# Patient Record
Sex: Female | Born: 1960 | Race: White | Hispanic: No | Smoking: Never smoker
Health system: Southern US, Community
[De-identification: ages and names within clinical notes are randomized; demographics above are authoritative.]

## PROBLEM LIST (undated history)

## (undated) DIAGNOSIS — F32A Depression, unspecified: Secondary | ICD-10-CM

## (undated) DIAGNOSIS — F329 Major depressive disorder, single episode, unspecified: Secondary | ICD-10-CM

## (undated) DIAGNOSIS — E669 Obesity, unspecified: Secondary | ICD-10-CM

## (undated) DIAGNOSIS — I10 Essential (primary) hypertension: Secondary | ICD-10-CM

## (undated) DIAGNOSIS — F419 Anxiety disorder, unspecified: Secondary | ICD-10-CM

## (undated) DIAGNOSIS — M109 Gout, unspecified: Secondary | ICD-10-CM

## (undated) DIAGNOSIS — M1990 Unspecified osteoarthritis, unspecified site: Secondary | ICD-10-CM

## (undated) HISTORY — DX: Unspecified osteoarthritis, unspecified site: M19.90

## (undated) HISTORY — DX: Essential (primary) hypertension: I10

## (undated) HISTORY — DX: Anxiety disorder, unspecified: F41.9

## (undated) HISTORY — DX: Major depressive disorder, single episode, unspecified: F32.9

## (undated) HISTORY — DX: Obesity, unspecified: E66.9

## (undated) HISTORY — PX: APPENDECTOMY: SHX54

## (undated) HISTORY — DX: Depression, unspecified: F32.A

## (undated) HISTORY — DX: Gout, unspecified: M10.9

---

## 1985-08-09 HISTORY — PX: CHOLECYSTECTOMY: SHX55

## 2005-12-16 ENCOUNTER — Emergency Department: Payer: Self-pay | Admitting: Emergency Medicine

## 2007-05-06 IMAGING — CT CT ANGIOGRAPHY NECK
1 of 8 series · 8 of 33 positions shown · IV contrast (APPLIED)
Comparison: none

REASON FOR EXAM: Headache
COMMENTS:

PROCEDURE:     CT  - CT ANGIOGRAPHY NECK W/WO  - December 16, 2005  [DATE]
RESULT:       Emergent CTA was performed of the neck.  The LEFT vertebral is
small compared to the RIGHT.  However, no obvious dissection is seen.  The
carotid arteries appear intact.

[Series 6: inspace · axial · 0.32mm/px · z∈[-712,-520]mm · 8 of 495 slices shown]
[im 55/495  soft-tissue]
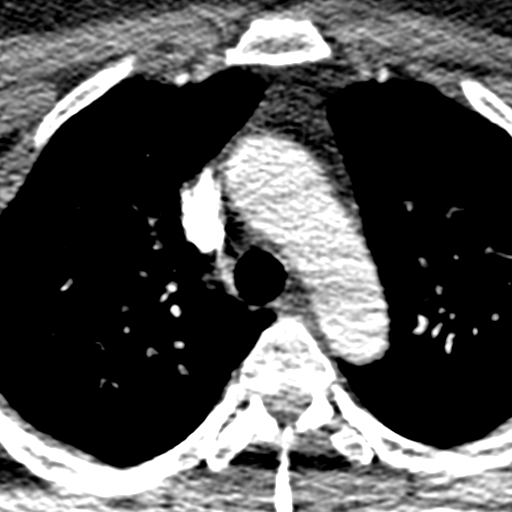
[im 110/495  bone]
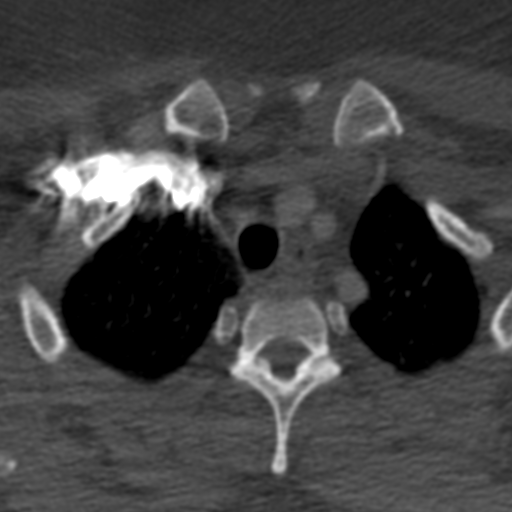
[im 165/495  soft-tissue]
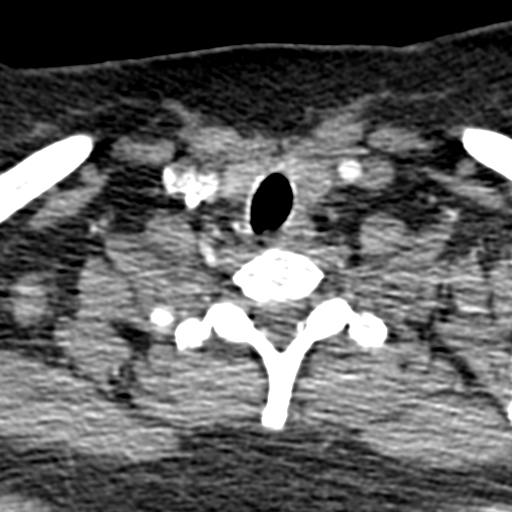
[im 220/495  bone]
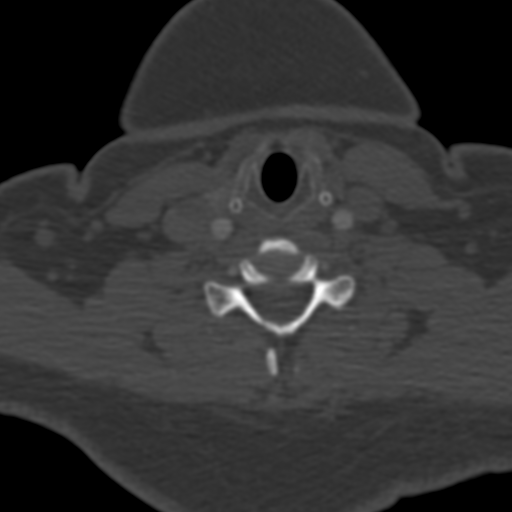
[im 275/495  soft-tissue]
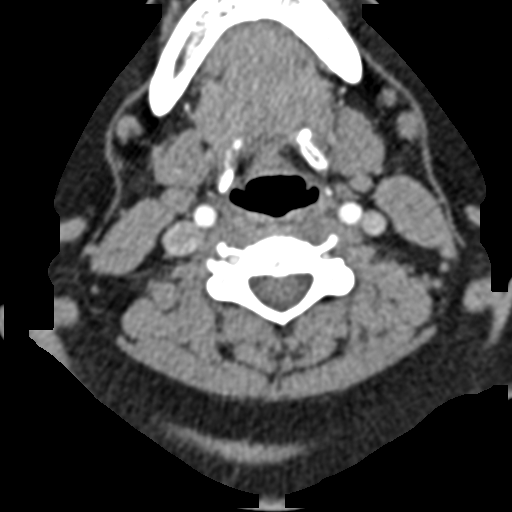
[im 330/495  bone]
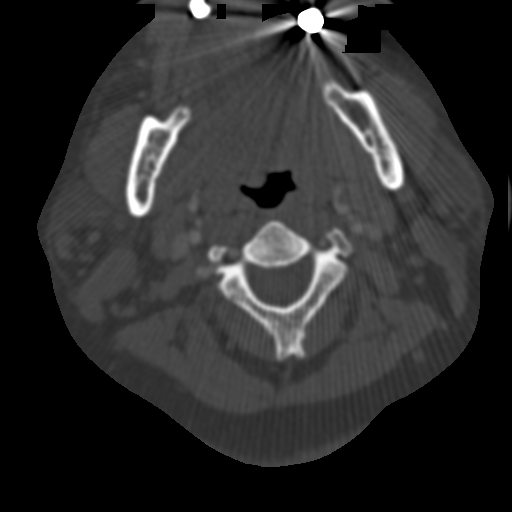
[im 385/495  soft-tissue]
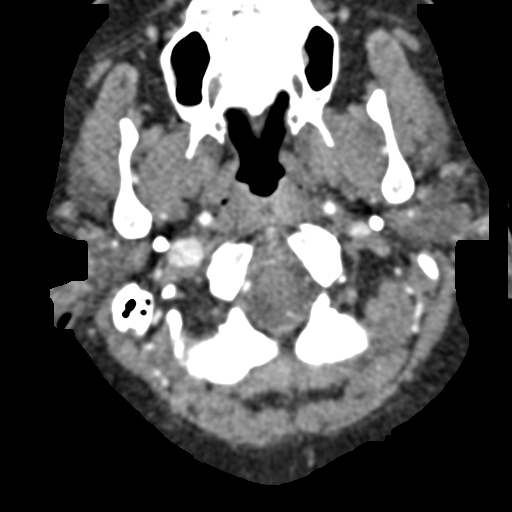
[im 440/495  bone]
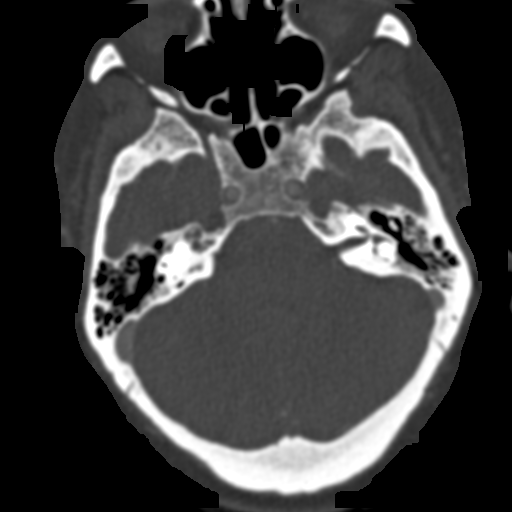

[8 of 33 positions shown; findings below may reference images not displayed]

IMPRESSION: No definitive vertebral artery dissection noted.

## 2007-11-25 ENCOUNTER — Other Ambulatory Visit: Payer: Self-pay

## 2007-11-25 ENCOUNTER — Emergency Department: Payer: Self-pay | Admitting: Emergency Medicine

## 2009-08-19 ENCOUNTER — Ambulatory Visit: Payer: Self-pay

## 2009-09-19 ENCOUNTER — Emergency Department (HOSPITAL_COMMUNITY): Admission: EM | Admit: 2009-09-19 | Discharge: 2009-09-19 | Payer: Self-pay | Admitting: Emergency Medicine

## 2011-02-07 IMAGING — CR DG CHEST 2V
2 series · 2 of 2 positions shown · non-contrast
Comparison: None

CLINICAL DATA: Upper respiratory infection.  Cough, congestion,
shortness of breath and mucus production for 3 days

CHEST - 2 VIEW

[w chest pa]
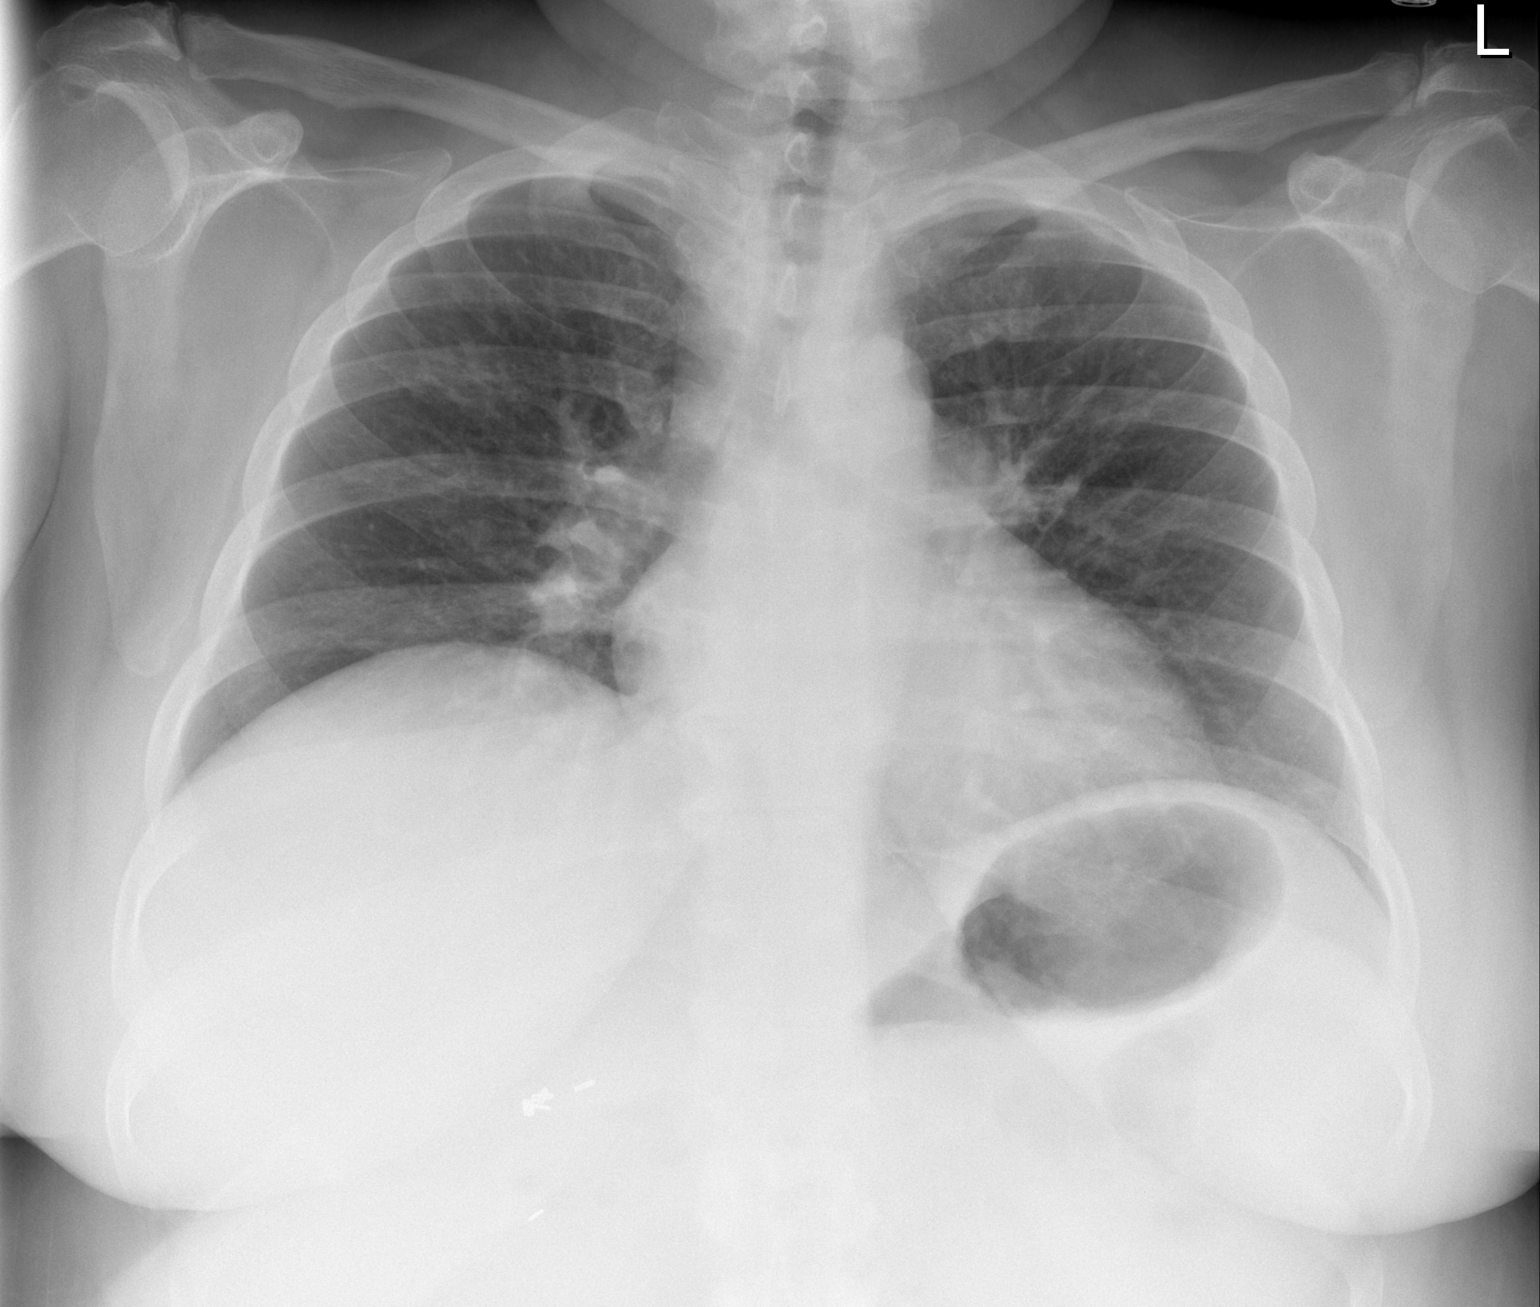

[w chest lat]
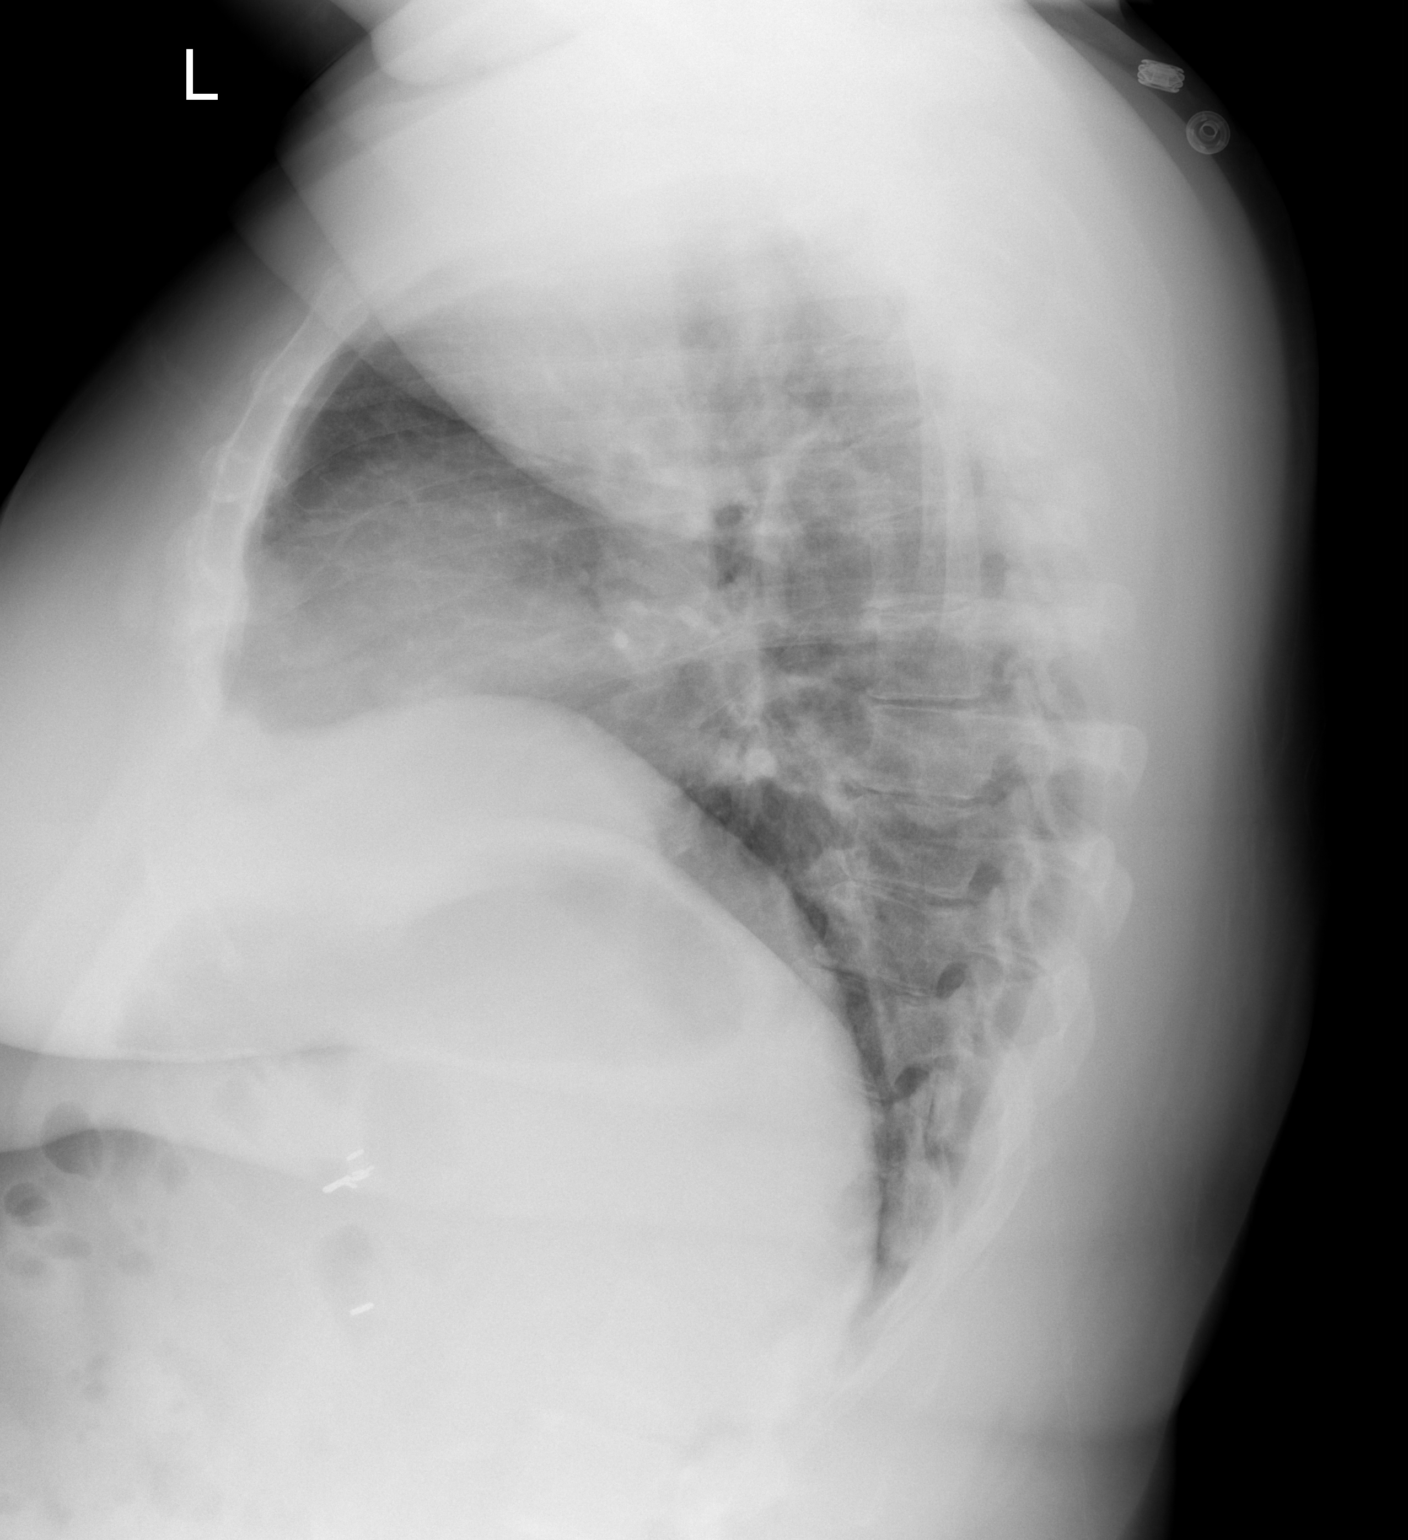

[2 of 2 positions shown; findings below may reference images not displayed]

FINDINGS: Low lung volumes are present.  Taking this into
consideration heart and mediastinal contours are within normal
limits.  The lung fields appear clear with no signs of focal
infiltrate or congestive failure.  No pleural fluid is seen.  No
significant peribronchial cuffing is noted.

Bony structures demonstrate degenerative anterior osteophytosis of
the lower thoracic spine and are otherwise intact.
IMPRESSION: No acute cardiopulmonary disease noted.  Low lung volumes

## 2013-10-30 LAB — HM PAP SMEAR

## 2013-11-19 LAB — HM MAMMOGRAPHY: HM MAMMO: NORMAL (ref 0–4)

## 2013-11-26 ENCOUNTER — Ambulatory Visit: Payer: Self-pay | Admitting: Specialist

## 2014-05-31 ENCOUNTER — Ambulatory Visit: Payer: Self-pay | Admitting: Gastroenterology

## 2014-08-07 ENCOUNTER — Emergency Department: Payer: Self-pay | Admitting: Emergency Medicine

## 2014-09-09 ENCOUNTER — Ambulatory Visit: Payer: Self-pay | Admitting: Specialist

## 2014-09-09 LAB — BASIC METABOLIC PANEL
Anion Gap: 6 — ABNORMAL LOW (ref 7–16)
BUN: 14 mg/dL (ref 7–18)
CO2: 29 mmol/L (ref 21–32)
Calcium, Total: 9.2 mg/dL (ref 8.5–10.1)
Chloride: 104 mmol/L (ref 98–107)
Creatinine: 0.77 mg/dL (ref 0.60–1.30)
EGFR (Non-African Amer.): >60
Glucose: 104 mg/dL — ABNORMAL HIGH (ref 65–99)
Osmolality: 278 (ref 275–301)
Potassium: 4.7 mmol/L (ref 3.5–5.1)
Sodium: 139 mmol/L (ref 136–145)

## 2014-09-10 ENCOUNTER — Inpatient Hospital Stay: Payer: Self-pay | Admitting: Specialist

## 2014-09-11 LAB — BASIC METABOLIC PANEL
Anion Gap: 9 (ref 7–16)
BUN: 9 mg/dL (ref 7–18)
CHLORIDE: 107 mmol/L (ref 98–107)
CREATININE: 0.74 mg/dL (ref 0.60–1.30)
Calcium, Total: 8.1 mg/dL — ABNORMAL LOW (ref 8.5–10.1)
Co2: 25 mmol/L (ref 21–32)
Glucose: 113 mg/dL — ABNORMAL HIGH (ref 65–99)
Osmolality: 281 (ref 275–301)
POTASSIUM: 4.4 mmol/L (ref 3.5–5.1)
Sodium: 141 mmol/L (ref 136–145)

## 2014-09-11 LAB — ALBUMIN: Albumin: 3.1 g/dL — ABNORMAL LOW (ref 3.4–5.0)

## 2014-09-11 LAB — MAGNESIUM: Magnesium: 1.7 mg/dL — ABNORMAL LOW

## 2014-09-11 LAB — CBC WITH DIFFERENTIAL/PLATELET
BASOS ABS: 0 10*3/uL (ref 0.0–0.1)
Basophil %: 0.2 %
Eosinophil #: 0 10*3/uL (ref 0.0–0.7)
Eosinophil %: 0 %
HCT: 38.6 % (ref 35.0–47.0)
HGB: 13.3 g/dL (ref 12.0–16.0)
Lymphocyte #: 1.4 10*3/uL (ref 1.0–3.6)
Lymphocyte %: 13.1 %
MCH: 29.3 pg (ref 26.0–34.0)
MCHC: 34.6 g/dL (ref 32.0–36.0)
MCV: 85 fL (ref 80–100)
MONOS PCT: 7.5 %
Monocyte #: 0.8 x10 3/mm (ref 0.2–0.9)
NEUTROS PCT: 79.2 %
Neutrophil #: 8.6 10*3/uL — ABNORMAL HIGH (ref 1.4–6.5)
PLATELETS: 202 10*3/uL (ref 150–440)
RBC: 4.55 10*6/uL (ref 3.80–5.20)
RDW: 14.4 % (ref 11.5–14.5)
WBC: 10.9 10*3/uL (ref 3.6–11.0)

## 2014-09-11 LAB — PHOSPHORUS: PHOSPHORUS: 3.4 mg/dL (ref 2.5–4.9)

## 2014-12-02 LAB — SURGICAL PATHOLOGY

## 2014-12-08 NOTE — Op Note (Signed)
PATIENT NAME:  Sandra [name], Sandra [name] MR#:  [id number] DATE OF BIRTH:  09-13-1960  DATE OF PROCEDURE:  09/10/2014  PREOPERATIVE DIAGNOSIS: Morbid obesity.   POSTOPERATIVE DIAGNOSIS: Morbid obesity.   PROCEDURE: Laparoscopic sleeve gastrectomy.   SURGEON: Primus BravoJon Idali Lafever, M.D.   ASSISTANT: Mariella SaaSarah Stout, PA-C  COMPLICATIONS: None.   ANESTHESIA: General endotracheal.   SPECIMENS: Portion of stomach.   ESTIMATED BLOOD LOSS: None.   CLINICAL HISTORY: See history and physical.   DETAILS OF PROCEDURE: The patient was taken to the operating room and placed on the operating table in the supine position. Appropriate monitors and supplemental oxygen delivered. Broad-spectrum IV antibiotics were administered and sequential stockings were placed. The patient was placed under general anesthesia without incident. The abdomen was prepped and draped in the usual sterile fashion. Timeout was performed. The abdomen was accessed using [name] 5 mm optical trocar, in the left upper quadrant. Pneumoperitoneum was established without difficulty. Multiple other ports were placed in preparation for the sleeve. [name] 36-French perforated bougie was placed down into the antrum. No hiatal hernia was present on inspection. The greater curvature of the stomach was mobilized using the Harmonic scalpel, including the fundus medially, and the posterior pancreatic attachments taken down as well. Once this was completely freed, the bougie was advanced to the antrum. The antrum bisected using an echelon green load reinforced stapler at 6 cm from the pylorus. The remainder of the fires were using [name] gold load with Seamguard. In [name] curvilinear fashion parallel to the lesser curvature, excess stomach was removed without difficulty. No bleeding was present. The staple line was intact. Liver retractor and trocars were removed. No bleeding occurred from any of the sites. The wounds were closed using 4-0 Vicryl and Dermabond. The patient tolerated the procedure  well and arrived to the recovery room in stable condition. She will be discharged home likely tomorrow. She will be admitted to the floor overnight.  ____________________________ Primus BravoJon Lynsee Wands, MD jb:sb D: 09/10/2014 14:13:40 ET T: 09/10/2014 14:37:24 ET JOB#: [id number]  cc: Primus BravoJon Jahmire Ruffins, MD, <Dictator> Geoffry ParadiseJON M Genola Yuille MD ELECTRONICALLY SIGNED 09/10/2014 16:31

## 2015-04-16 IMAGING — CR DG CHEST 2V
1 series · 2 of 2 positions shown · non-contrast
Comparison: None.

CLINICAL DATA: Hypertension.  Obesity.

EXAM:
CHEST  2 VIEW

[Series 1: w chest pa · 0.14mm/px · 2 of 2 slices shown]
[im 1/2]
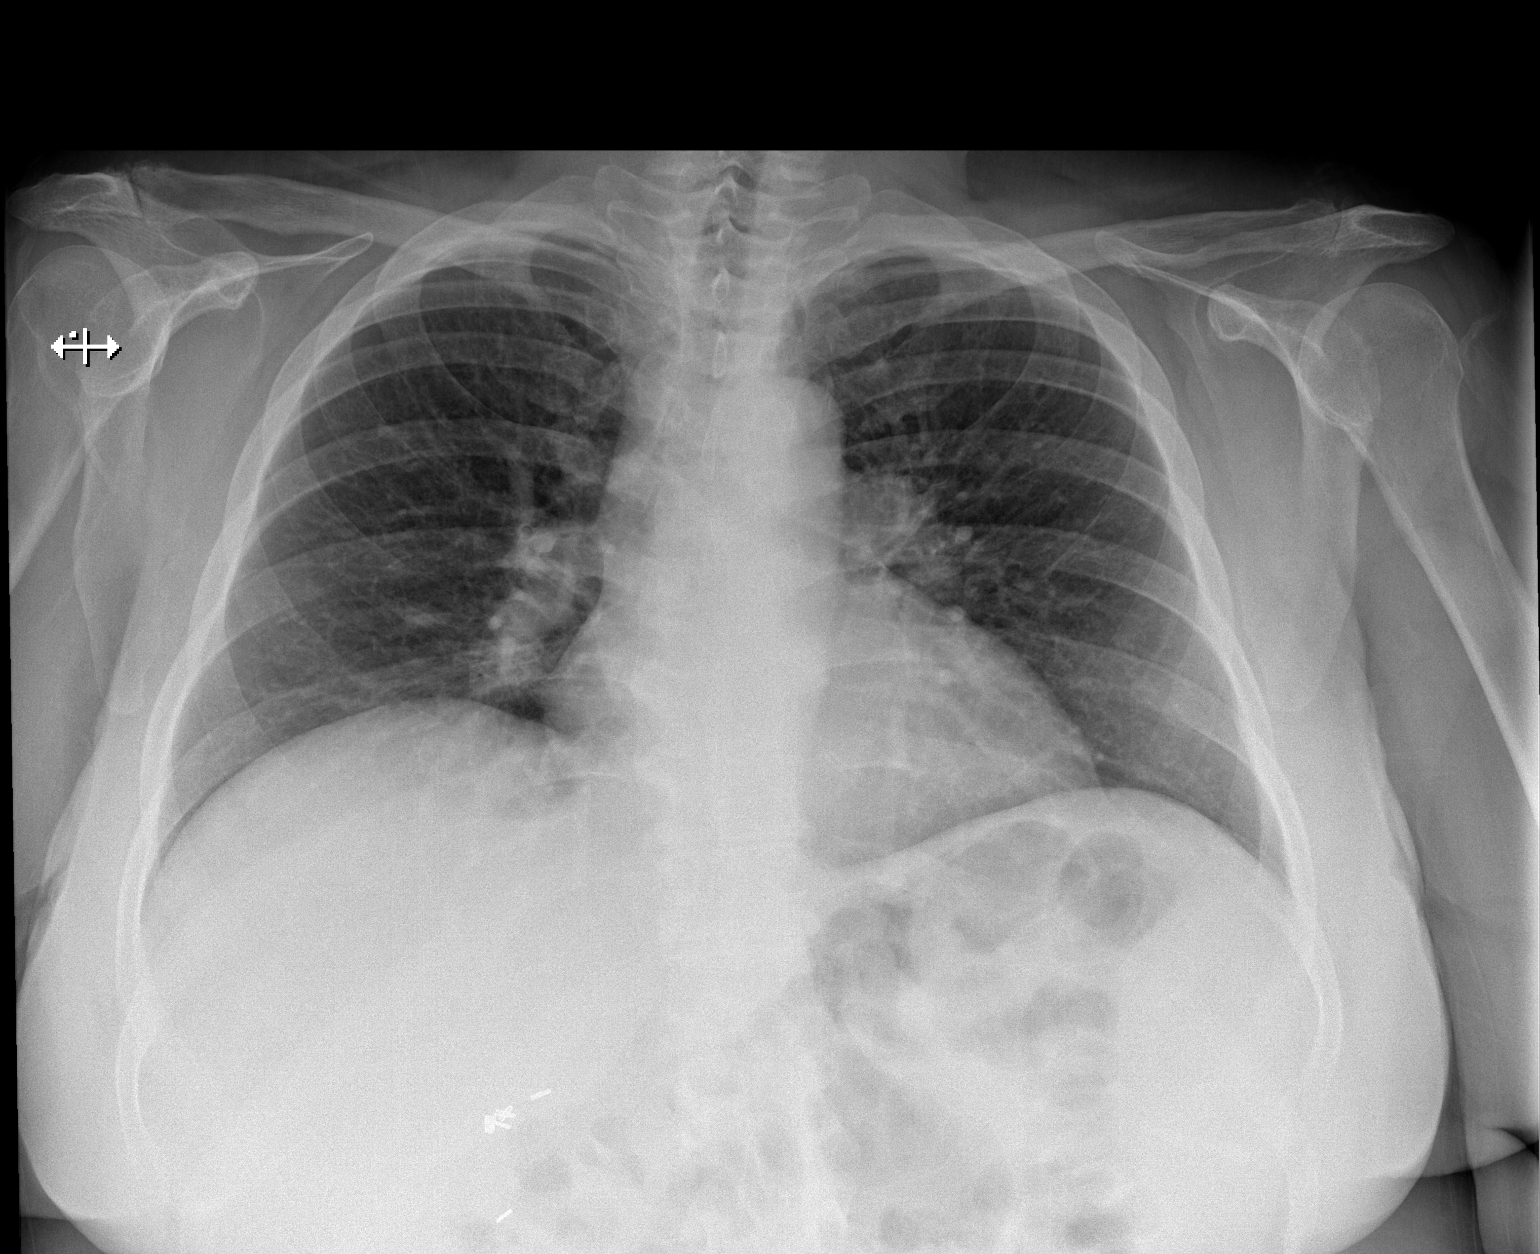
[im 2/2]
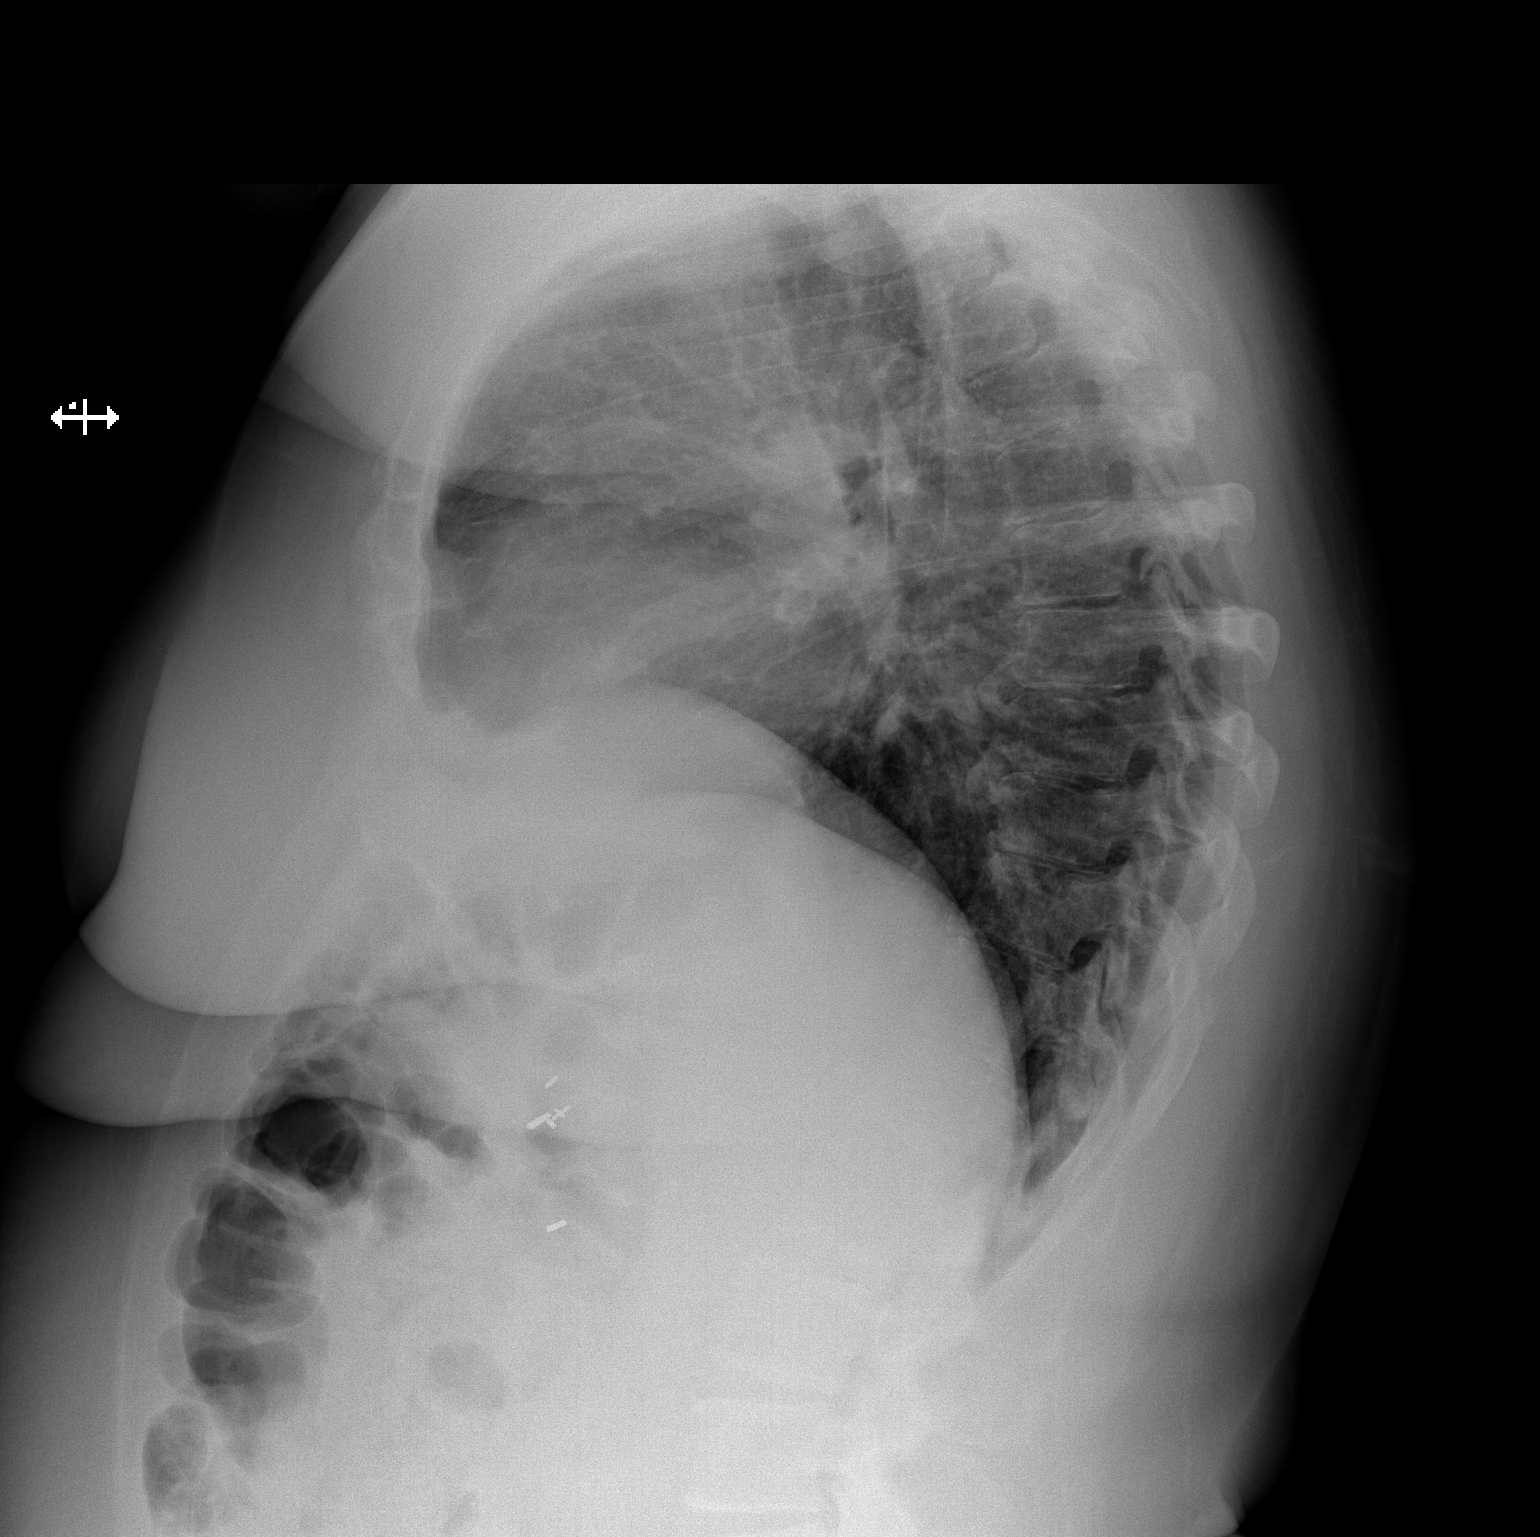

[2 of 2 positions shown; findings below may reference images not displayed]

FINDINGS: Mediastinum hilar structures unremarkable. Poor inspiration. Heart
size and pulmonary vascularity normal. No focal infiltrate. No
pleural effusion or pneumothorax. Surgical clips right upper
quadrant.
IMPRESSION: Poor inspiration.  No acute cardiopulmonary disease .

## 2016-05-25 ENCOUNTER — Ambulatory Visit: Payer: Self-pay | Admitting: Family Medicine

## 2016-05-27 ENCOUNTER — Ambulatory Visit (INDEPENDENT_AMBULATORY_CARE_PROVIDER_SITE_OTHER): Payer: 59 | Admitting: Family Medicine

## 2016-05-27 ENCOUNTER — Telehealth: Payer: Self-pay | Admitting: Family Medicine

## 2016-05-27 ENCOUNTER — Encounter: Payer: Self-pay | Admitting: Family Medicine

## 2016-05-27 VITALS — BP 123/86 | HR 85 | Temp 97.8°F | Ht 59.2 in | Wt 172.2 lb

## 2016-05-27 DIAGNOSIS — Z1211 Encounter for screening for malignant neoplasm of colon: Secondary | ICD-10-CM

## 2016-05-27 DIAGNOSIS — Z0189 Encounter for other specified special examinations: Secondary | ICD-10-CM

## 2016-05-27 DIAGNOSIS — Z008 Encounter for other general examination: Secondary | ICD-10-CM

## 2016-05-27 DIAGNOSIS — Z1231 Encounter for screening mammogram for malignant neoplasm of breast: Secondary | ICD-10-CM

## 2016-05-27 DIAGNOSIS — Z1239 Encounter for other screening for malignant neoplasm of breast: Secondary | ICD-10-CM

## 2016-05-27 NOTE — Telephone Encounter (Signed)
called pt to let her know that the paper work she wanted us to fax couldn't be fax because it didn't have the fax number on it or who to fax it to.I ask her to call back to get this informatio

## 2016-05-27 NOTE — Progress Notes (Signed)
   BP 123/86 (BP Location: Left Arm, Patient Position: Sitting, Cuff Size: Normal)   Pulse 85   Temp 97.8 F (36.6 C)   Ht 4' 11.2" (1.504 m)   Wt 172 lb 3.2 oz (78.1 kg)   SpO2 99%   BMI 34.55 kg/m    Subjective:    Patient ID: Sandra [name], female    DOB: Jul 21, 1961, 55 y.o.   MRN: [id number]  HPI: Sandra [name] is a 55 y.o. female  Chief Complaint  Patient presents with  . BMI Index calculation    Pt states that she is here to get her BMI calculated so that she can appeal your insurance company through work. The pt states that she had the gastric sleeve surgery 2 years ago. She follows a no carb no sugar diet everything is low cal food. Pt states that she works out 2 days a week and walks one day a week.    Patient presents for biometric screening appeal regarding her BMI. She is s/p gastric sleeve x 2 years and has lost a total of 72 lb overall. Having trouble keeping weight off, but has been exercising at least twice weekly and eating small portions, restricting carbohydrates. Just started weight watchers the past week or so.   Relevant past medical, surgical, family and social history reviewed and updated as indicated. Interim medical history since our last visit reviewed. Allergies and medications reviewed and updated.  Review of Systems  Constitutional: Negative.   HENT: Negative.   Respiratory: Negative.   Cardiovascular: Negative.   Gastrointestinal: Negative.   Genitourinary: Negative.   Musculoskeletal: Negative.   Skin: Negative.   Neurological: Negative.   Hematological: Negative.   Psychiatric/Behavioral: Negative.     Per HPI unless specifically indicated above     Objective:    BP 123/86 (BP Location: Left Arm, Patient Position: Sitting, Cuff Size: Normal)   Pulse 85   Temp 97.8 F (36.6 C)   Ht 4' 11.2" (1.504 m)   Wt 172 lb 3.2 oz (78.1 kg)   SpO2 99%   BMI 34.55 kg/m   Wt Readings from Last 3 Encounters:  05/27/16 172 lb 3.2  oz (78.1 kg)  06/05/14 209 lb (94.8 kg)    Physical Exam  Constitutional: She is oriented to person, place, and time. She appears well-developed and well-nourished. No distress.  HENT:  Head: Atraumatic.  Eyes: Conjunctivae are normal. No scleral icterus.  Neck: Normal range of motion. Neck supple.  Cardiovascular: Normal rate.   Pulmonary/Chest: Effort normal. No respiratory distress.  Musculoskeletal: Normal range of motion.  Neurological: She is alert and oriented to person, place, and time.  Skin: Skin is warm and dry.  Psychiatric: She has a normal mood and affect. Her behavior is normal.  Nursing note and vitals reviewed.     Assessment & Plan:   Problem List Items Addressed This Visit    None    Visit Diagnoses    Encounter for biometric screening    -  Primary   Form filled out   Colon cancer screening       Relevant Orders   Ambulatory referral to Gastroenterology   Breast cancer screening       Relevant Orders   MM Digital Screening       Follow up plan: Return if symptoms worsen or fail to improve.

## 2016-05-27 NOTE — Patient Instructions (Signed)
Follow up as needed

## 2016-05-28 NOTE — Telephone Encounter (Signed)
We don't have a fax number and I spent 20 minutes on hold with company. Left message for patient to pick up her form.

## 2018-09-07 DIAGNOSIS — M109 Gout, unspecified: Secondary | ICD-10-CM | POA: Insufficient documentation

## 2018-09-13 DIAGNOSIS — Z9884 Bariatric surgery status: Secondary | ICD-10-CM | POA: Insufficient documentation

## 2019-01-19 DIAGNOSIS — Z6835 Body mass index (BMI) 35.0-35.9, adult: Secondary | ICD-10-CM | POA: Insufficient documentation

## 2019-01-19 MED ORDER — CVS EAR DROPS OT
40.00 | OTIC | Status: DC
Start: 2019-01-20 — End: 2019-01-19

## 2019-01-19 MED ORDER — ASPIRIN-CAFFEINE PO
5.00 | ORAL | Status: DC
Start: ? — End: 2019-01-19

## 2019-01-19 MED ORDER — GENERIC EXTERNAL MEDICATION
12.50 | Status: DC
Start: ? — End: 2019-01-19

## 2019-01-19 MED ORDER — ALBUTEROL SULFATE (2.5 MG/3ML) 0.083% IN NEBU
2.50 | INHALATION_SOLUTION | RESPIRATORY_TRACT | Status: DC
Start: ? — End: 2019-01-19

## 2019-01-19 MED ORDER — DIPHENHYDRAMINE HCL 50 MG/ML IJ SOLN
25.00 | INTRAMUSCULAR | Status: DC
Start: ? — End: 2019-01-19

## 2019-01-19 MED ORDER — PANTOPRAZOLE SODIUM 40 MG IV SOLR
40.00 | INTRAVENOUS | Status: DC
Start: 2019-01-20 — End: 2019-01-19

## 2019-01-19 MED ORDER — EQUATE NICOTINE 4 MG MT GUM
4.00 | CHEWING_GUM | OROMUCOSAL | Status: DC
Start: ? — End: 2019-01-19

## 2019-01-19 MED ORDER — LOVENOX 150 MG/ML ~~LOC~~ SOLN
1.00 | SUBCUTANEOUS | Status: DC
Start: ? — End: 2019-01-19

## 2019-01-19 MED ORDER — BARO-CAT PO
150.00 | ORAL | Status: DC
Start: ? — End: 2019-01-19

## 2019-01-19 MED ORDER — PHENYLEPHRINE-GUAIFENESIN 20-375 MG PO CP12
10.00 | ORAL_CAPSULE | ORAL | Status: DC
Start: ? — End: 2019-01-19

## 2019-08-06 DIAGNOSIS — M1A09X0 Idiopathic chronic gout, multiple sites, without tophus (tophi): Secondary | ICD-10-CM | POA: Insufficient documentation

## 2020-02-07 DIAGNOSIS — I10 Essential (primary) hypertension: Secondary | ICD-10-CM | POA: Insufficient documentation

## 2020-10-23 DIAGNOSIS — F411 Generalized anxiety disorder: Secondary | ICD-10-CM | POA: Insufficient documentation

## 2020-10-23 DIAGNOSIS — F331 Major depressive disorder, recurrent, moderate: Secondary | ICD-10-CM | POA: Insufficient documentation

## 2021-08-21 ENCOUNTER — Other Ambulatory Visit: Payer: Self-pay

## 2021-08-21 ENCOUNTER — Encounter: Payer: Self-pay | Admitting: Nurse Practitioner

## 2021-08-21 ENCOUNTER — Ambulatory Visit (INDEPENDENT_AMBULATORY_CARE_PROVIDER_SITE_OTHER): Payer: Self-pay | Admitting: Nurse Practitioner

## 2021-08-21 VITALS — BP 135/81 | HR 65 | Temp 98.2°F | Ht 59.6 in | Wt 159.0 lb

## 2021-08-21 DIAGNOSIS — F411 Generalized anxiety disorder: Secondary | ICD-10-CM

## 2021-08-21 DIAGNOSIS — F331 Major depressive disorder, recurrent, moderate: Secondary | ICD-10-CM

## 2021-08-21 DIAGNOSIS — I10 Essential (primary) hypertension: Secondary | ICD-10-CM

## 2021-08-21 DIAGNOSIS — Z7689 Persons encountering health services in other specified circumstances: Secondary | ICD-10-CM

## 2021-08-21 DIAGNOSIS — R141 Gas pain: Secondary | ICD-10-CM

## 2021-08-21 MED ORDER — LOSARTAN POTASSIUM 100 MG PO TABS: ORAL_TABLET | ORAL | 1 refills | Status: DC

## 2021-08-21 NOTE — Progress Notes (Signed)
BP 135/81    Pulse 65    Temp 98.2 F (36.8 C) (Oral)    Ht 4' 11.6" (1.514 m)    Wt 159 lb (72.1 kg)    LMP  (LMP Unknown)    SpO2 99%    BMI 31.47 kg/m    Subjective:    Patient ID: Sandra [name], female    DOB: Jan 06, 1961, 61 y.o.   MRN: [id number]  HPI: Sandra [name] is a 61 y.o. female  Chief Complaint  Patient presents with   New Patient (Initial Visit)   Establish Care    Patient presents to clinic to establish care with new PCP.  Introduced to Designer, jewellery role and practice setting.  All questions answered.  Discussed provider/patient relationship and expectations.  Patient reports a history of hypertension, anxiety (doesn't take any medication), Gout, headaches, history of bariatric surgery- 2020 was her most recent.  She had a previous bariatric surgery in 2015.  She has the bariatric surgery at [facility].   Patient denies a history of: Hypertension, Elevated Cholesterol, Diabetes, Thyroid problems, Depression, Anxiety, Neurological problems, and Abdominal problems.   HYPERTENSION Hypertension status: controlled  Satisfied with current treatment? no Duration of hypertension: years BP monitoring frequency:  daily BP range: 140/90 BP medication side effects:  no Medication compliance: excellent compliance Previous BP meds:losartan (cozaar) Aspirin: no Recurrent headaches: yes Visual changes: no Palpitations: no Dyspnea: no Chest pain: no Lower extremity edema: no Dizzy/lightheaded: no  ABDOMINAL ISSUES Patient states since she is having a lot of gas pain since her surgery.  She is following the diet that she was given. Finished her follow up with the bariatric surgery.    Active Ambulatory Problems    Diagnosis Date Noted   BMI 35.0-35.9,adult 01/19/2019   Essential hypertension 02/07/2020   Generalized anxiety disorder 10/23/2020   Idiopathic chronic gout of multiple sites without tophus 08/06/2019   Moderate episode of recurrent  major depressive disorder ([place]) 10/23/2020   Morbid obesity ([place]) 01/18/2019   Status post bariatric surgery 09/13/2018   Abdominal gas pain 08/21/2021   Resolved Ambulatory Problems    Diagnosis Date Noted   No Resolved Ambulatory Problems   Past Medical History:  Diagnosis Date   Anxiety    Arthritis    Depression    Gout    Hypertension    Obesity    Past Surgical History:  Procedure Laterality Date   APPENDECTOMY     CESAREAN SECTION     x 2   CHOLECYSTECTOMY  1987   Family History  Problem Relation Age of Onset   Heart disease Mother    Heart attack Mother    Hypertension Mother    Cancer Mother        breast   Stroke Mother    Heart disease Father    Heart attack Father      Review of Systems  Eyes:  Negative for visual disturbance.  Respiratory:  Negative for cough, chest tightness and shortness of breath.   Cardiovascular:  Negative for chest pain, palpitations and leg swelling.  Neurological:  Positive for headaches. Negative for dizziness.   Per HPI unless specifically indicated above     Objective:    BP 135/81    Pulse 65    Temp 98.2 F (36.8 C) (Oral)    Ht 4' 11.6" (1.514 m)    Wt 159 lb (72.1 kg)    LMP  (LMP Unknown)    SpO2 99%  BMI 31.47 kg/m   Wt Readings from Last 3 Encounters:  08/21/21 159 lb (72.1 kg)  05/27/16 172 lb 3.2 oz (78.1 kg)  06/05/14 209 lb (94.8 kg)    Physical Exam Vitals and nursing note reviewed.  Constitutional:      General: She is not in acute distress.    Appearance: Normal appearance. She is normal weight. She is not ill-appearing, toxic-appearing or diaphoretic.  HENT:     Head: Normocephalic.     Right Ear: Tympanic membrane and external ear normal.     Left Ear: Tympanic membrane and external ear normal.     Nose: Nose normal.     Mouth/Throat:     Mouth: Mucous membranes are moist.     Pharynx: Oropharynx is clear.  Eyes:     General:        Right eye: No discharge.        Left eye: No  discharge.     Extraocular Movements: Extraocular movements intact.     Conjunctiva/sclera: Conjunctivae normal.     Pupils: Pupils are equal, round, and reactive to light.  Cardiovascular:     Rate and Rhythm: Normal rate and regular rhythm.     Heart sounds: No murmur heard. Pulmonary:     Effort: Pulmonary effort is normal. No respiratory distress.     Breath sounds: Normal breath sounds. No wheezing or rales.  Musculoskeletal:     Cervical back: Normal range of motion and neck supple.  Skin:    General: Skin is warm and dry.     Capillary Refill: Capillary refill takes less than 2 seconds.  Neurological:     General: No focal deficit present.     Mental Status: She is alert and oriented to person, place, and time. Mental status is at baseline.  Psychiatric:        Mood and Affect: Mood normal.        Behavior: Behavior normal.        Thought Content: Thought content normal.        Judgment: Judgment normal.    Results for orders placed or performed in visit on 05/24/16  HM MAMMOGRAPHY  Result Value Ref Range   HM Mammogram Self Reported Normal 0-4 Bi-Rad, Self Reported Normal  HM PAP SMEAR  Result Value Ref Range   HM Pap smear per PP       Assessment & Plan:   Problem List Items Addressed This Visit       Cardiovascular and Mediastinum   Essential hypertension    Chronic.  Controlled.  Continue with current medication regimen of Losartan 100mg  daily.  Refill sent today.  Return to clinic in 6 weeks for reevaluation.  Call sooner if concerns arise.        Relevant Medications   losartan (COZAAR) 100 MG tablet     Other   Generalized anxiety disorder    Chronic.  Controlled without medication.  Return to clinic in 6 months for reevaluation.  Call sooner if concerns arise.        Moderate episode of recurrent major depressive disorder (HCC)    Chronic.  Controlled without medication. Return to clinic in 6 months for reevaluation.  Call sooner if concerns  arise.        Morbid obesity ([place])    Has a history of bariatric surgery x 2.  Most recent in 2020.  Completed follow up with bariatric surgeon.      Abdominal gas pain  Patient consuming a lot of green leafy vegetables. Recommend modifying diet to avoid green leafy vegetables. If not improved, will send to GI. Avoid medications such as gas x due to history of bariatric surgery.      Other Visit Diagnoses     Encounter to establish care    -  Primary        Follow up plan: Return in about 6 weeks (around 10/02/2021) for BP Check and abdominal concerns.

## 2021-08-21 NOTE — Assessment & Plan Note (Signed)
Chronic.  Controlled without medication.  Return to clinic in 6 months for reevaluation.  Call sooner if concerns arise.   

## 2021-08-21 NOTE — Assessment & Plan Note (Signed)
Patient consuming a lot of green leafy vegetables. Recommend modifying diet to avoid green leafy vegetables. If not improved, will send to GI. Avoid medications such as gas x due to history of bariatric surgery.

## 2021-08-21 NOTE — Assessment & Plan Note (Signed)
Has a history of bariatric surgery x 2.  Most recent in 2020.  Completed follow up with bariatric surgeon.

## 2021-08-21 NOTE — Assessment & Plan Note (Signed)
Chronic.  Controlled.  Continue with current medication regimen of Losartan 100mg  daily.  Refill sent today.  Return to clinic in 6 weeks for reevaluation.  Call sooner if concerns arise.

## 2021-10-02 ENCOUNTER — Other Ambulatory Visit: Payer: Self-pay

## 2021-10-02 ENCOUNTER — Encounter: Payer: Self-pay | Admitting: Nurse Practitioner

## 2021-10-02 ENCOUNTER — Ambulatory Visit (INDEPENDENT_AMBULATORY_CARE_PROVIDER_SITE_OTHER): Payer: Self-pay | Admitting: Nurse Practitioner

## 2021-10-02 VITALS — BP 134/78 | HR 65 | Temp 98.6°F | Wt 159.2 lb

## 2021-10-02 DIAGNOSIS — R141 Gas pain: Secondary | ICD-10-CM

## 2021-10-02 DIAGNOSIS — F411 Generalized anxiety disorder: Secondary | ICD-10-CM

## 2021-10-02 DIAGNOSIS — I10 Essential (primary) hypertension: Secondary | ICD-10-CM

## 2021-10-02 MED ORDER — COLCHICINE 0.6 MG PO TABS: 0.6000 mg | ORAL_TABLET | Freq: Every day | ORAL | 1 refills | Status: DC | PRN

## 2021-10-02 NOTE — Assessment & Plan Note (Signed)
Chronic.  Controlled.  Continue with current medication regimen of Losartan 100mg daily.    Return to clinic in 3 months for reevaluation.  Call sooner if concerns arise.  ? ?

## 2021-10-02 NOTE — Assessment & Plan Note (Signed)
Referral placed for patient to see a therapist.  Also recommended patient to try [facility] to match with a therapist.  Follow up in 3 months for reevaluation.

## 2021-10-02 NOTE — Patient Instructions (Signed)
[facility]- therapy app

## 2021-10-02 NOTE — Assessment & Plan Note (Signed)
Ongoing. Improved with decreasing Greeny Leafy Vegetables. Continue to modify diet.  Recommend patient see GI doctor if symptoms persist.

## 2021-10-02 NOTE — Progress Notes (Signed)
BP 134/78    Pulse 65    Temp 98.6 F (37 C) (Oral)    Wt 159 lb 3.2 oz (72.2 kg)    LMP  (LMP Unknown)    SpO2 98%    BMI 31.51 kg/m    Subjective:    Patient ID: Sandra [name], female    DOB: [birth year], 61 y.o.   MRN: [id number]  HPI: Sandra [name] is a 61 y.o. female  Chief Complaint  Patient presents with   Hypertension    6 week f/up   Bloated    Pt states she is still having issues with her stomach and being bloated    Therapy    Pt states she would like to discuss going to therapy for mental health    HYPERTENSION Hypertension status: controlled  Satisfied with current treatment? no Duration of hypertension: years BP monitoring frequency:  daily BP range: 132/80 BP medication side effects:  no Medication compliance: excellent compliance Previous BP meds:losartan (cozaar) Aspirin: no Recurrent headaches: no Visual changes: no Palpitations: no Dyspnea: no Chest pain: no Lower extremity edema: no Dizzy/lightheaded: no  ABDOMINAL CONCERNS Patient is here to follow up on her abdominal pain.  States she has times where she has to run right to the bathroom.  She had decreased the green leafy vegetables which helped some but she still has a lot of bloating.    Patient would like to see a therapist.  She feels like she needs someone else to talk to.  Relevant past medical, surgical, family and social history reviewed and updated as indicated. Interim medical history since our last visit reviewed. Allergies and medications reviewed and updated.  Review of Systems  Eyes:  Negative for visual disturbance.  Respiratory:  Negative for cough, chest tightness and shortness of breath.   Cardiovascular:  Negative for chest pain, palpitations and leg swelling.  Gastrointestinal:  Positive for abdominal pain.  Neurological:  Negative for dizziness and headaches.  Psychiatric/Behavioral:  The patient is nervous/anxious.    Per HPI unless specifically  indicated above     Objective:    BP 134/78    Pulse 65    Temp 98.6 F (37 C) (Oral)    Wt 159 lb 3.2 oz (72.2 kg)    LMP  (LMP Unknown)    SpO2 98%    BMI 31.51 kg/m   Wt Readings from Last 3 Encounters:  10/02/21 159 lb 3.2 oz (72.2 kg)  08/21/21 159 lb (72.1 kg)  05/27/16 172 lb 3.2 oz (78.1 kg)    Physical Exam Vitals and nursing note reviewed.  Constitutional:      General: She is not in acute distress.    Appearance: Normal appearance. She is normal weight. She is not ill-appearing, toxic-appearing or diaphoretic.  HENT:     Head: Normocephalic.     Right Ear: External ear normal.     Left Ear: External ear normal.     Nose: Nose normal.     Mouth/Throat:     Mouth: Mucous membranes are moist.     Pharynx: Oropharynx is clear.  Eyes:     General:        Right eye: No discharge.        Left eye: No discharge.     Extraocular Movements: Extraocular movements intact.     Conjunctiva/sclera: Conjunctivae normal.     Pupils: Pupils are equal, round, and reactive to light.  Cardiovascular:     Rate and  Rhythm: Normal rate and regular rhythm.     Heart sounds: No murmur heard. Pulmonary:     Effort: Pulmonary effort is normal. No respiratory distress.     Breath sounds: Normal breath sounds. No wheezing or rales.  Musculoskeletal:     Cervical back: Normal range of motion and neck supple.  Skin:    General: Skin is warm and dry.     Capillary Refill: Capillary refill takes less than 2 seconds.  Neurological:     General: No focal deficit present.     Mental Status: She is alert and oriented to person, place, and time. Mental status is at baseline.  Psychiatric:        Mood and Affect: Mood normal.        Behavior: Behavior normal.        Thought Content: Thought content normal.        Judgment: Judgment normal.    Results for orders placed or performed in visit on 05/24/16  HM MAMMOGRAPHY  Result Value Ref Range   HM Mammogram Self Reported Normal 0-4 Bi-Rad,  Self Reported Normal  HM PAP SMEAR  Result Value Ref Range   HM Pap smear per PP       Assessment & Plan:   Problem List Items Addressed This Visit       Cardiovascular and Mediastinum   Essential hypertension - Primary    Chronic.  Controlled.  Continue with current medication regimen of Losartan 100mg  daily.  Return to clinic in 3 months for reevaluation.  Call sooner if concerns arise.          Other   Generalized anxiety disorder    Referral placed for patient to see a therapist.  Also recommended patient to try [facility] to match with a therapist.  Follow up in 3 months for reevaluation.      Relevant Orders   Ambulatory referral to Psychology   Abdominal gas pain    Ongoing. Improved with decreasing Greeny Leafy Vegetables. Continue to modify diet.  Recommend patient see GI doctor if symptoms persist.        Follow up plan: Return in about 3 months (around 12/30/2021) for BP Check.

## 2021-12-24 NOTE — Progress Notes (Signed)
BP (!) 179/83   Pulse (!) 54   Temp 97.9 F (36.6 C) (Oral)   Wt 160 lb 4.8 oz (72.7 kg)   LMP  (LMP Unknown)   SpO2 97%   BMI 31.73 kg/m    Subjective:    Patient ID: Sandra [name], female    DOB: 07-07-1961, 61 y.o.   MRN: [id number]  HPI: Sandra [name] is a 61 y.o. female  Chief Complaint  Patient presents with   Anxiety   Hypertension    Follow up    Abdominal Pain    Ongoing- lots of gas per patient    HYPERTENSION Hypertension status: uncontrolled Satisfied with current treatment? no Duration of hypertension: years BP monitoring frequency:  daily BP range: 140-150/80 BP medication side effects:  no Medication compliance: excellent compliance Previous BP meds:losartan (cozaar) Aspirin: no Recurrent headaches: no Visual changes: no Palpitations: no Dyspnea: no Chest pain: no Lower extremity edema: no Dizzy/lightheaded: no  ABDOMINAL CONCERNS Patient is here to follow up on her abdominal pain.  States she has times where she has to run right to the bathroom.  She had decreased the green leafy vegetables which helped some but she still has a lot of bloating.  She has not been following a strict diet.  She has been more lenient with her diet.    ANXIETY Patient states she has been very stressed lately.  She is trying to move and her job has been very stressful.   Patient would like to see a therapist.  She feels like she needs someone else to talk to.  Relevant past medical, surgical, family and social history reviewed and updated as indicated. Interim medical history since our last visit reviewed. Allergies and medications reviewed and updated.  Review of Systems  Eyes:  Negative for visual disturbance.  Respiratory:  Negative for cough, chest tightness and shortness of breath.   Cardiovascular:  Negative for chest pain, palpitations and leg swelling.  Gastrointestinal:  Positive for abdominal pain.  Neurological:  Negative for dizziness and headaches.   Psychiatric/Behavioral:  The patient is nervous/anxious.    Per HPI unless specifically indicated above     Objective:    BP (!) 179/83   Pulse (!) 54   Temp 97.9 F (36.6 C) (Oral)   Wt 160 lb 4.8 oz (72.7 kg)   LMP  (LMP Unknown)   SpO2 97%   BMI 31.73 kg/m   Wt Readings from Last 3 Encounters:  12/25/21 160 lb 4.8 oz (72.7 kg)  10/02/21 159 lb 3.2 oz (72.2 kg)  08/21/21 159 lb (72.1 kg)    Physical Exam Vitals and nursing note reviewed.  Constitutional:      General: She is not in acute distress.    Appearance: Normal appearance. She is normal weight. She is not ill-appearing, toxic-appearing or diaphoretic.  HENT:     Head: Normocephalic.     Right Ear: External ear normal.     Left Ear: External ear normal.     Nose: Nose normal.     Mouth/Throat:     Mouth: Mucous membranes are moist.     Pharynx: Oropharynx is clear.  Eyes:     General:        Right eye: No discharge.        Left eye: No discharge.     Extraocular Movements: Extraocular movements intact.     Conjunctiva/sclera: Conjunctivae normal.     Pupils: Pupils are equal, round, and reactive to light.  Cardiovascular:     Rate and Rhythm: Normal rate and regular rhythm.     Heart sounds: No murmur heard. Pulmonary:     Effort: Pulmonary effort is normal. No respiratory distress.     Breath sounds: Normal breath sounds. No wheezing or rales.  Musculoskeletal:     Cervical back: Normal range of motion and neck supple.  Skin:    General: Skin is warm and dry.     Capillary Refill: Capillary refill takes less than 2 seconds.  Neurological:     General: No focal deficit present.     Mental Status: She is alert and oriented to person, place, and time. Mental status is at baseline.  Psychiatric:        Mood and Affect: Mood normal.        Behavior: Behavior normal.        Thought Content: Thought content normal.        Judgment: Judgment normal.    Results for orders placed or performed in visit  on 05/24/16  HM MAMMOGRAPHY  Result Value Ref Range   HM Mammogram Self Reported Normal 0-4 Bi-Rad, Self Reported Normal  HM PAP SMEAR  Result Value Ref Range   HM Pap smear per PP       Assessment & Plan:   Problem List Items Addressed This Visit       Cardiovascular and Mediastinum   Essential hypertension - Primary    Chronic. Not well controlled.  Will add Amlodipine 20m daily.  Side effects and benefits of medication discussed during visit.  Continue with Losartan.  Follow up in 1 month for reevaluation.       Relevant Medications   amLODipine (NORVASC) 5 MG tablet   Other Relevant Orders   Comp Met (CMET)     Other   Generalized anxiety disorder    Chronic. Has been worse lately due to stress in her life.  Will reassess at next visit.       Status post bariatric surgery    Patient would like to discuss weight loss medications.  Recommend she speak with her Bariatric surgeon to get the okay to start medications.  Will discuss further at our follow up.       Abdominal gas pain    Chronic. Ongoing since bariatric surgery.  Patient has not been diligent about her diet.  Recommend she see Gi and have a colonoscopy done.  Recommend she follow up with her Bariatric surgeon to discuss her abdominal pain.       Relevant Orders   Ambulatory referral to Gastroenterology   Other Visit Diagnoses     Screening for ischemic heart disease       Relevant Orders   Lipid Profile        Follow up plan: Return in about 1 month (around 01/25/2022) for BP Check.

## 2021-12-25 ENCOUNTER — Encounter: Payer: Self-pay | Admitting: Nurse Practitioner

## 2021-12-25 ENCOUNTER — Ambulatory Visit (INDEPENDENT_AMBULATORY_CARE_PROVIDER_SITE_OTHER): Payer: PRIVATE HEALTH INSURANCE | Admitting: Nurse Practitioner

## 2021-12-25 VITALS — BP 179/83 | HR 54 | Temp 97.9°F | Wt 160.3 lb

## 2021-12-25 DIAGNOSIS — Z136 Encounter for screening for cardiovascular disorders: Secondary | ICD-10-CM | POA: Diagnosis not present

## 2021-12-25 DIAGNOSIS — F411 Generalized anxiety disorder: Secondary | ICD-10-CM | POA: Diagnosis not present

## 2021-12-25 DIAGNOSIS — I10 Essential (primary) hypertension: Secondary | ICD-10-CM

## 2021-12-25 DIAGNOSIS — Z9884 Bariatric surgery status: Secondary | ICD-10-CM

## 2021-12-25 DIAGNOSIS — F331 Major depressive disorder, recurrent, moderate: Secondary | ICD-10-CM

## 2021-12-25 DIAGNOSIS — R141 Gas pain: Secondary | ICD-10-CM | POA: Diagnosis not present

## 2021-12-25 MED ORDER — AMLODIPINE BESYLATE 5 MG PO TABS: 5.0000 mg | ORAL_TABLET | Freq: Every day | ORAL | 0 refills | Status: DC

## 2021-12-25 NOTE — Assessment & Plan Note (Signed)
Patient would like to discuss weight loss medications.  Recommend she speak with her Bariatric surgeon to get the okay to start medications.  Will discuss further at our follow up.

## 2021-12-25 NOTE — Assessment & Plan Note (Signed)
Chronic. Has been worse lately due to stress in her life.  Will reassess at next visit.

## 2021-12-25 NOTE — Assessment & Plan Note (Signed)
Chronic. Ongoing since bariatric surgery.  Patient has not been diligent about her diet.  Recommend she see Gi and have a colonoscopy done.  Recommend she follow up with her Bariatric surgeon to discuss her abdominal pain.

## 2021-12-25 NOTE — Assessment & Plan Note (Signed)
Chronic. Not well controlled.  Will add Amlodipine 5mg  daily.  Side effects and benefits of medication discussed during visit.  Continue with Losartan.  Follow up in 1 month for reevaluation.

## 2021-12-26 LAB — COMPREHENSIVE METABOLIC PANEL
ALT: 17 IU/L (ref 0–32)
AST: 19 IU/L (ref 0–40)
Albumin/Globulin Ratio: 2 (ref 1.2–2.2)
Albumin: 4.2 g/dL (ref 3.8–4.8)
Alkaline Phosphatase: 78 IU/L (ref 44–121)
BUN/Creatinine Ratio: 20 (ref 12–28)
BUN: 10 mg/dL (ref 8–27)
Bilirubin Total: 0.7 mg/dL (ref 0.0–1.2)
CO2: 24 mmol/L (ref 20–29)
Calcium: 8.8 mg/dL (ref 8.7–10.3)
Chloride: 107 mmol/L — ABNORMAL HIGH (ref 96–106)
Creatinine, Ser: 0.51 mg/dL — ABNORMAL LOW (ref 0.57–1.00)
Globulin, Total: 2.1 g/dL (ref 1.5–4.5)
Glucose: 92 mg/dL (ref 70–99)
Potassium: 3.9 mmol/L (ref 3.5–5.2)
Sodium: 145 mmol/L — ABNORMAL HIGH (ref 134–144)
Total Protein: 6.3 g/dL (ref 6.0–8.5)
eGFR: 105 mL/min/{1.73_m2} (ref >59)

## 2021-12-26 LAB — LIPID PANEL
Chol/HDL Ratio: 3 ratio (ref 0.0–4.4)
Cholesterol, Total: 151 mg/dL (ref 100–199)
HDL: 51 mg/dL (ref >39)
LDL Chol Calc (NIH): 81 mg/dL (ref 0–99)
Triglycerides: 101 mg/dL (ref 0–149)
VLDL Cholesterol Cal: 19 mg/dL (ref 5–40)

## 2021-12-28 ENCOUNTER — Encounter: Payer: Self-pay | Attending: Psychology | Admitting: Psychology

## 2021-12-28 NOTE — Progress Notes (Signed)
Please let patient know that her lab work looks good.  It does looks like she was a little dehydrated.  Make sure to drink at least 64 ounces of water daily.

## 2022-01-07 ENCOUNTER — Ambulatory Visit: Payer: Self-pay | Admitting: *Deleted

## 2022-01-07 NOTE — Telephone Encounter (Signed)
Third attempt to call pt. LM on VM to call back. Advised pt to discuss with weight loss doctor who put her on the phentermine as well. Advised pt will route her note to office. Advised pt to call back if she has not heard a response form a provider by 4 pm tomorrow.

## 2022-01-07 NOTE — Telephone Encounter (Signed)
Message from Kandis Cocking sent at 01/07/2022  2:21 PM EDT  Summary: Discuss B12 and phentermine 35mg    Patient called in saying she just received a B12 shot and a prescription for phentermine 35mg  from weight loss clinic. Patient would like to make sure it is ok for her to have both considering her blood pressure.

## 2022-01-07 NOTE — Telephone Encounter (Signed)
Attempted to return her call.   Left a voicemail to call back to discuss medications with a nurse.

## 2022-01-08 NOTE — Telephone Encounter (Signed)
Attempted to call pt regarding Sandra [name] advise, pt did not answer. Will try again. LVMTRC.   OK for PEC/Nurse triage to relay provider's suggestion if pt calls back.

## 2022-01-08 NOTE — Telephone Encounter (Signed)
I recommend patient discuss her concerns with the provider that wrote the prescription.

## 2022-01-25 ENCOUNTER — Other Ambulatory Visit: Payer: Self-pay | Admitting: Nurse Practitioner

## 2022-01-25 MED ORDER — LOSARTAN POTASSIUM 100 MG PO TABS: ORAL_TABLET | ORAL | 1 refills | Status: DC

## 2022-01-25 NOTE — Telephone Encounter (Signed)
Pt called in to follow up on a refill and stated she has no medication left.  Pt asked if the PCP did not want her taking the medication anymore because it hadn't been refilled.   I advised that medication refills can take up to 48-72 hours.

## 2022-01-25 NOTE — Telephone Encounter (Signed)
Norvasc is a duplicate. Requested Prescriptions  Pending Prescriptions Disp Refills  . losartan (COZAAR) 100 MG tablet 90 tablet 1    Sig: SMARTSIG:1 Tablet(s) By Mouth Every Evening     Cardiovascular:  Angiotensin Receptor Blockers Failed - 01/25/2022  2:07 PM      Failed - Cr in normal range and within 180 days    Creatinine  Date Value Ref Range Status  09/11/2014 0.74 0.60 - 1.30 mg/dL Final   Creatinine, Ser  Date Value Ref Range Status  12/25/2021 0.51 (L) 0.57 - 1.00 mg/dL Final         Failed - Last BP in normal range    BP Readings from Last 1 Encounters:  12/25/21 (!) 179/83         Passed - K in normal range and within 180 days    Potassium  Date Value Ref Range Status  12/25/2021 3.9 3.5 - 5.2 mmol/L Final  09/11/2014 4.4 3.5 - 5.1 mmol/L Final         Passed - Patient is not pregnant      Passed - Valid encounter within last 6 months    Recent Outpatient Visits          1 month ago Essential hypertension   [facility] Larae Grooms, NP   3 months ago Essential hypertension   [facility] Larae Grooms, NP   5 months ago Encounter to establish care   [facility] Larae Grooms, NP   5 years ago Encounter for biometric screening   [facility], [place], [place]      Future Appointments            In 1 week Mecum, Oswaldo Conroy, PA-C [employer], [facility]           Refused Prescriptions Disp Refills  . amLODipine (NORVASC) 5 MG tablet 30 tablet 0    Sig: Take 1 tablet (5 mg total) by mouth daily.     Cardiovascular: Calcium Channel Blockers 2 Failed - 01/25/2022  2:07 PM      Failed - Last BP in normal range    BP Readings from Last 1 Encounters:  12/25/21 (!) 179/83         Passed - Last Heart Rate in normal range    Pulse Readings from Last 1 Encounters:  12/25/21 (!) 54         Passed - Valid encounter within last 6 months    Recent Outpatient Visits          1  month ago Essential hypertension   [facility] Larae Grooms, NP   3 months ago Essential hypertension   [facility] Larae Grooms, NP   5 months ago Encounter to establish care   [facility] Larae Grooms, NP   5 years ago Encounter for biometric screening   [facility], [place], [place]      Future Appointments            In 1 week Mecum, Oswaldo Conroy, PA-C [employer], [facility]

## 2022-01-25 NOTE — Telephone Encounter (Signed)
Copied from CRM [id number]. Topic: General - Other >> Jan 25, 2022  9:44 AM Everette C wrote: Reason for CRM: Medication Refill - Medication: amLODipine (NORVASC) 5 MG tablet [[id number]] - patient 0 tablets remaining   losartan (COZAAR) 100 MG tablet [[id number]]   Has the patient contacted their pharmacy? No.The patient was uncertain of the medication's status  (Agent: If no, request that the patient contact the pharmacy for the refill. If patient does not wish to contact the pharmacy document the reason why and proceed with request.) (Agent: If yes, when and what did the pharmacy advise?)  Preferred Pharmacy (with phone number or street name): [facility] [address] [place] - [address] [place] [address] Phone: [phone] Fax: [phone] Hours: Not open 24 hours   Has the patient been seen for an appointment in the last year OR does the patient have an upcoming appointment? Yes.    Agent: Please be advised that RX refills may take up to 3 business days. We ask that you follow-up with your pharmacy.

## 2022-01-25 NOTE — Telephone Encounter (Signed)
Requested Prescriptions  Pending Prescriptions Disp Refills  . amLODipine (NORVASC) 5 MG tablet [Pharmacy Med Name: amLODIPine Besylate 5 MG Oral Tablet] 30 tablet 0    Sig: Take 1 tablet by mouth once daily     Cardiovascular: Calcium Channel Blockers 2 Failed - 01/25/2022  9:42 AM      Failed - Last BP in normal range    BP Readings from Last 1 Encounters:  12/25/21 (!) 179/83         Passed - Last Heart Rate in normal range    Pulse Readings from Last 1 Encounters:  12/25/21 (!) 54         Passed - Valid encounter within last 6 months    Recent Outpatient Visits          1 month ago Essential hypertension   [facility] Larae Grooms, NP   3 months ago Essential hypertension   [facility] Larae Grooms, NP   5 months ago Encounter to establish care   [facility] Larae Grooms, NP   5 years ago Encounter for biometric screening   [facility], [place], [place]      Future Appointments            In 1 week Mecum, Oswaldo Conroy, PA-C [employer], [facility]

## 2022-02-01 ENCOUNTER — Ambulatory Visit: Payer: PRIVATE HEALTH INSURANCE | Admitting: Physician Assistant

## 2022-02-01 ENCOUNTER — Encounter: Payer: Self-pay | Admitting: Physician Assistant

## 2022-02-01 VITALS — BP 125/82 | HR 71 | Temp 99.0°F | Ht 59.61 in | Wt 164.2 lb

## 2022-02-01 DIAGNOSIS — Z9884 Bariatric surgery status: Secondary | ICD-10-CM | POA: Diagnosis not present

## 2022-02-01 DIAGNOSIS — I10 Essential (primary) hypertension: Secondary | ICD-10-CM

## 2022-02-01 NOTE — Assessment & Plan Note (Addendum)
Patient reports she had procedure approx 3 years ago States she is no longer losing weight and wants to discuss "injections"  Was advised by PCP to follow up with surgeon's office for clearance. She states she went to [facility] and was given B12 shots and diet pills. Based on her summary it does not appear she was evaluated for her bariatric surgery nor were labs run at this clinic I discussed this with patient- do not recommend she take the diet pills as this could have impact on her HTN  Recommended patient return to her bariatric surgeon's office for evaluation and assessment to start desired weight loss medications Follow up after she has this apt with PCP.

## 2022-02-17 ENCOUNTER — Telehealth: Payer: Self-pay

## 2022-02-17 DIAGNOSIS — H9193 Unspecified hearing loss, bilateral: Secondary | ICD-10-CM

## 2022-02-17 NOTE — Telephone Encounter (Signed)
Copied from CRM [id number]. Topic: Referral - Request for Referral >> Feb 17, 2022  1:36 PM Macon Large wrote: Has patient seen PCP for this complaint? Yes.   *If NO, is insurance requiring patient see PCP for this issue before PCP can refer them? Referral for which specialty: Audiology Preferred provider/office: no specific location Reason for referral: Pt requests hearing test

## 2022-02-17 NOTE — Telephone Encounter (Signed)
Copied from CRM [id number]. Topic: General - Other >> Feb 17, 2022  1:29 PM Sandra [name] wrote: Reason for CRM: Pt reports that she has not been able to get her medical records from [facility] in [place] regarding surgery done by Dr. Kandice [name]. Pt stated that doctor is no longer at that location so she would like to ask if it is possible for Sandra [name] to get those records.   Looks as if records are located in care everywhere.

## 2022-02-18 NOTE — Telephone Encounter (Signed)
Notes printed. Attempted to call pt. LVMTRC. Records will be up front for pick up.

## 2022-02-19 NOTE — Telephone Encounter (Signed)
Pt called back reporting that she wants to get started taking Ozempic, she says that she does not need the records but instead Larae Grooms does. She wants to know if she can go ahead and get started, she wants a call back for scheduling  Best contact: [phone]

## 2022-02-22 NOTE — Telephone Encounter (Signed)
Attempted to call patient regarding Ozempic, she will need an appt. LVMTRC.   OK for PEC/Nurse Triage to schedule an office visit to discuss medication.

## 2022-03-01 ENCOUNTER — Other Ambulatory Visit: Payer: Self-pay | Admitting: Nurse Practitioner

## 2022-03-01 NOTE — Telephone Encounter (Signed)
Medication Refill - Medication: amLODipine (NORVASC) 5 MG tablet  Has the patient contacted their pharmacy? No.   Preferred Pharmacy (with phone number or street name):   [facility] [address] [place], [place] - [address] Phone:  [phone]  Fax:  [phone]     Has the patient been seen for an appointment in the last year OR does the patient have an upcoming appointment? Yes.

## 2022-03-02 ENCOUNTER — Ambulatory Visit: Payer: PRIVATE HEALTH INSURANCE | Admitting: Nurse Practitioner

## 2022-03-02 ENCOUNTER — Encounter: Payer: Self-pay | Admitting: Nurse Practitioner

## 2022-03-02 VITALS — BP 119/78 | HR 75 | Temp 98.6°F | Wt 163.4 lb

## 2022-03-02 DIAGNOSIS — I10 Essential (primary) hypertension: Secondary | ICD-10-CM | POA: Diagnosis not present

## 2022-03-02 DIAGNOSIS — E669 Obesity, unspecified: Secondary | ICD-10-CM | POA: Diagnosis not present

## 2022-03-02 MED ORDER — AMLODIPINE BESYLATE 5 MG PO TABS: 5.0000 mg | ORAL_TABLET | Freq: Every day | ORAL | 1 refills | Status: DC

## 2022-03-02 NOTE — Progress Notes (Signed)
BP 119/78   Pulse 75   Temp 98.6 F (37 C) (Oral)   Wt 163 lb 6.4 oz (74.1 kg)   LMP  (LMP Unknown)   SpO2 99%   BMI 32.34 kg/m    Subjective:    Patient ID: Sandra [name], female    DOB: [birth year], 61 y.o.   MRN: [id number]  HPI: Sandra [name] is a 61 y.o. female  Chief Complaint  Patient presents with   Obesity    Pt states she would like to discuss her weight and nutrition   Hypertension   Ear Pain    states she went to [facility] and was treated with Z-pack. States her L ear is still bothering   HYPERTENSION Hypertension status: controlled Satisfied with current treatment? no Duration of hypertension: years BP monitoring frequency:  daily BP range: 120-130/70-80 BP medication side effects:  no Medication compliance: excellent compliance Previous BP meds:losartan (cozaar) Aspirin: no Recurrent headaches: no Visual changes: no Palpitations: no Dyspnea: no Chest pain: no Lower extremity edema: no Dizzy/lightheaded: no  WEIGHT GAIN Duration: years Previous attempts at weight loss: yes Complications of obesity: HTN Peak weight: 163lb Weight loss goal:  Weight loss to date:  Requesting obesity pharmacotherapy: yes Current weight loss supplements/medications: no Previous weight loss supplements/meds: no She was given phentermine and it kept her away for 2 days.  Calories:       Relevant past medical, surgical, family and social history reviewed and updated as indicated. Interim medical history since our last visit reviewed. Allergies and medications reviewed and updated.  Review of Systems  Eyes:  Negative for visual disturbance.  Respiratory:  Negative for cough, chest tightness and shortness of breath.   Cardiovascular:  Negative for chest pain, palpitations and leg swelling.  Gastrointestinal:  Positive for abdominal pain.  Neurological:  Negative for dizziness and headaches.  Psychiatric/Behavioral:  The patient is nervous/anxious.     Per HPI unless  specifically indicated above     Objective:    BP 119/78   Pulse 75   Temp 98.6 F (37 C) (Oral)   Wt 163 lb 6.4 oz (74.1 kg)   LMP  (LMP Unknown)   SpO2 99%   BMI 32.34 kg/m   Wt Readings from Last 3 Encounters:  03/02/22 163 lb 6.4 oz (74.1 kg)  02/01/22 164 lb 3.2 oz (74.5 kg)  12/25/21 160 lb 4.8 oz (72.7 kg)    Physical Exam Vitals and nursing note reviewed.  Constitutional:      General: She is not in acute distress.    Appearance: Normal appearance. She is normal weight. She is not ill-appearing, toxic-appearing or diaphoretic.  HENT:     Head: Normocephalic.     Right Ear: External ear normal.     Left Ear: External ear normal.     Nose: Nose normal.     Mouth/Throat:     Mouth: Mucous membranes are moist.     Pharynx: Oropharynx is clear.  Eyes:     General:        Right eye: No discharge.        Left eye: No discharge.     Extraocular Movements: Extraocular movements intact.     Conjunctiva/sclera: Conjunctivae normal.     Pupils: Pupils are equal, round, and reactive to light.  Cardiovascular:     Rate and Rhythm: Normal rate and regular rhythm.     Heart sounds: No murmur heard. Pulmonary:     Effort: Pulmonary effort is  normal. No respiratory distress.     Breath sounds: Normal breath sounds. No wheezing or rales.  Musculoskeletal:     Cervical back: Normal range of motion and neck supple.  Skin:    General: Skin is warm and dry.     Capillary Refill: Capillary refill takes less than 2 seconds.  Neurological:     General: No focal deficit present.     Mental Status: She is alert and oriented to person, place, and time. Mental status is at baseline.  Psychiatric:        Mood and Affect: Mood normal.        Behavior: Behavior normal.        Thought Content: Thought content normal.        Judgment: Judgment normal.     Results for orders placed or performed in visit on 12/25/21  Comp Met (CMET)  Result Value Ref Range   Glucose 92 70 - 99  mg/dL   BUN 10 8 - 27 mg/dL   Creatinine, Ser 0.51 (L) 0.57 - 1.00 mg/dL   eGFR 105 >59 mL/min/1.73   BUN/Creatinine Ratio 20 12 - 28   Sodium 145 (H) 134 - 144 mmol/L   Potassium 3.9 3.5 - 5.2 mmol/L   Chloride 107 (H) 96 - 106 mmol/L   CO2 24 20 - 29 mmol/L   Calcium 8.8 8.7 - 10.3 mg/dL   Total Protein 6.3 6.0 - 8.5 g/dL   Albumin 4.2 3.8 - 4.8 g/dL   Globulin, Total 2.1 1.5 - 4.5 g/dL   Albumin/Globulin Ratio 2.0 1.2 - 2.2   Bilirubin Total 0.7 0.0 - 1.2 mg/dL   Alkaline Phosphatase 78 44 - 121 IU/L   AST 19 0 - 40 IU/L   ALT 17 0 - 32 IU/L  Lipid Profile  Result Value Ref Range   Cholesterol, Total 151 100 - 199 mg/dL   Triglycerides 101 0 - 149 mg/dL   HDL 51 >39 mg/dL   VLDL Cholesterol Cal 19 5 - 40 mg/dL   LDL Chol Calc (NIH) 81 0 - 99 mg/dL   Chol/HDL Ratio 3.0 0.0 - 4.4 ratio      Assessment & Plan:   Problem List Items Addressed This Visit       Cardiovascular and Mediastinum   Essential hypertension - Primary    Chronic.  Controlled.  Continue with current medication regimen.  Refilled Amlodipine.  Labs ordered today.  Return to clinic in 6 months for reevaluation.  Call sooner if concerns arise.        Relevant Medications   amLODipine (NORVASC) 5 MG tablet   Other Visit Diagnoses     Obesity (BMI 30-39.9)       Continue exercising in the gym. Recommend 120g of protein daily and <30g carbs per meal. Follow up in 2 months can consider weight loss medication at that time.        Follow up plan: Return in about 2 months (around 05/03/2022) for Weight Managment.

## 2022-03-02 NOTE — Assessment & Plan Note (Signed)
Chronic.  Controlled.  Continue with current medication regimen.  Refilled Amlodipine.  Labs ordered today.  Return to clinic in 6 months for reevaluation.  Call sooner if concerns arise.

## 2022-03-02 NOTE — Patient Instructions (Signed)
Wegovy Saxenda 

## 2022-03-03 NOTE — Telephone Encounter (Signed)
Requested medication (s) are due for refill today: no  Requested medication (s) are on the active medication list: yes  Last refill:  03/02/22  Future visit scheduled: yes  Notes to clinic:  Unable to refill per protocol, last refill by provider on 03/02/22 for 90 and 1 refill. Possible duplicate request.     Requested Prescriptions  Pending Prescriptions Disp Refills   amLODipine (NORVASC) 5 MG tablet 30 tablet 0    Sig: Take 1 tablet (5 mg total) by mouth daily.     Cardiovascular: Calcium Channel Blockers 2 Passed - 03/01/2022  3:40 PM      Passed - Last BP in normal range    BP Readings from Last 1 Encounters:  03/02/22 119/78         Passed - Last Heart Rate in normal range    Pulse Readings from Last 1 Encounters:  03/02/22 75         Passed - Valid encounter within last 6 months    Recent Outpatient Visits           Yesterday Essential hypertension   [facility] Larae Grooms, NP   1 month ago Essential hypertension   [facility] Mecum, Oswaldo Conroy, PA-C   2 months ago Essential hypertension   [facility] Larae Grooms, NP   5 months ago Essential hypertension   [facility] Larae Grooms, NP   6 months ago Encounter to establish care   [facility] Larae Grooms, NP       Future Appointments             In 2 months Larae Grooms, NP [facility], [facility]

## 2022-05-03 NOTE — Progress Notes (Unsigned)
   LMP  (LMP Unknown)    Subjective:    Patient ID: Sandra [name], female    DOB: June 10, 1961, 61 y.o.   MRN: [id number]  HPI: Sandra [name] is a 61 y.o. female  No chief complaint on file.  WEIGHT GAIN Duration:  Previous attempts at weight loss: {Blank SCBIPJ:79396::"UGA","YG"} Complications of obesity:  Peak weight:  Weight loss goal:  Weight loss to date:  Requesting obesity pharmacotherapy: {Blank single:19197::"yes","no"} Current weight loss supplements/medications: {Blank single:19197::"yes","no"} Previous weight loss supplements/meds: {Blank single:19197::"yes","no"} Calories:   Relevant past medical, surgical, family and social history reviewed and updated as indicated. Interim medical history since our last visit reviewed. Allergies and medications reviewed and updated.  Review of Systems  Per HPI unless specifically indicated above     Objective:    LMP  (LMP Unknown)   Wt Readings from Last 3 Encounters:  03/02/22 163 lb 6.4 oz (74.1 kg)  02/01/22 164 lb 3.2 oz (74.5 kg)  12/25/21 160 lb 4.8 oz (72.7 kg)    Physical Exam  Results for orders placed or performed in visit on 12/25/21  Comp Met (CMET)  Result Value Ref Range   Glucose 92 70 - 99 mg/dL   BUN 10 8 - 27 mg/dL   Creatinine, Ser 0.51 (L) 0.57 - 1.00 mg/dL   eGFR 105 >59 mL/min/1.73   BUN/Creatinine Ratio 20 12 - 28   Sodium 145 (H) 134 - 144 mmol/L   Potassium 3.9 3.5 - 5.2 mmol/L   Chloride 107 (H) 96 - 106 mmol/L   CO2 24 20 - 29 mmol/L   Calcium 8.8 8.7 - 10.3 mg/dL   Total Protein 6.3 6.0 - 8.5 g/dL   Albumin 4.2 3.8 - 4.8 g/dL   Globulin, Total 2.1 1.5 - 4.5 g/dL   Albumin/Globulin Ratio 2.0 1.2 - 2.2   Bilirubin Total 0.7 0.0 - 1.2 mg/dL   Alkaline Phosphatase 78 44 - 121 IU/L   AST 19 0 - 40 IU/L   ALT 17 0 - 32 IU/L  Lipid Profile  Result Value Ref Range   Cholesterol, Total 151 100 - 199 mg/dL   Triglycerides 101 0 - 149 mg/dL   HDL 51 >39 mg/dL   VLDL Cholesterol Cal 19 5 - 40  mg/dL   LDL Chol Calc (NIH) 81 0 - 99 mg/dL   Chol/HDL Ratio 3.0 0.0 - 4.4 ratio      Assessment & Plan:   Problem List Items Addressed This Visit       Other   BMI 35.0-35.9,adult - Primary     Follow up plan: No follow-ups on file.

## 2022-05-04 ENCOUNTER — Telehealth: Payer: Self-pay

## 2022-05-04 ENCOUNTER — Ambulatory Visit: Payer: PRIVATE HEALTH INSURANCE | Admitting: Nurse Practitioner

## 2022-05-04 ENCOUNTER — Encounter: Payer: Self-pay | Admitting: Nurse Practitioner

## 2022-05-04 VITALS — BP 119/76 | HR 61 | Temp 98.6°F | Wt 165.0 lb

## 2022-05-04 DIAGNOSIS — Z6835 Body mass index (BMI) 35.0-35.9, adult: Secondary | ICD-10-CM

## 2022-05-04 DIAGNOSIS — F331 Major depressive disorder, recurrent, moderate: Secondary | ICD-10-CM

## 2022-05-04 DIAGNOSIS — M5441 Lumbago with sciatica, right side: Secondary | ICD-10-CM | POA: Diagnosis not present

## 2022-05-04 DIAGNOSIS — Z1231 Encounter for screening mammogram for malignant neoplasm of breast: Secondary | ICD-10-CM

## 2022-05-04 MED ORDER — CYCLOBENZAPRINE HCL 5 MG PO TABS: 5.0000 mg | ORAL_TABLET | Freq: Every day | ORAL | 0 refills | Status: DC

## 2022-05-04 NOTE — Assessment & Plan Note (Signed)
Would like to discuss weight loss options.  Will wait till medications are back in stock.

## 2022-05-04 NOTE — Assessment & Plan Note (Signed)
Chronic. Declines treatment options at this time.

## 2022-05-04 NOTE — Telephone Encounter (Signed)
Attempted to call patient to inform her of Mammogram appt for Oct. 19th at 3:20 PM at [facility] [place]

## 2022-05-04 NOTE — Telephone Encounter (Signed)
-----   Message from Jon Billings, NP sent at 05/04/2022 10:03 AM EDT ----- Can we schedule her mammo.

## 2022-06-15 ENCOUNTER — Telehealth: Payer: Self-pay

## 2022-06-15 NOTE — Telephone Encounter (Signed)
Attempted to call patient to see if we can schedule her mammogram . [facility]

## 2022-06-15 NOTE — Telephone Encounter (Signed)
-----   Message from Jon Billings, NP sent at 06/14/2022  4:17 PM EST ----- Can we see if this patient will let us schedule her mammogram?

## 2022-08-03 ENCOUNTER — Encounter: Payer: Self-pay | Admitting: Nurse Practitioner

## 2022-08-03 ENCOUNTER — Ambulatory Visit (INDEPENDENT_AMBULATORY_CARE_PROVIDER_SITE_OTHER): Payer: PRIVATE HEALTH INSURANCE | Admitting: Nurse Practitioner

## 2022-08-03 VITALS — BP 132/81 | HR 71 | Temp 98.6°F | Ht 59.5 in | Wt 167.0 lb

## 2022-08-03 DIAGNOSIS — Z0000 Encounter for general adult medical examination without abnormal findings: Secondary | ICD-10-CM

## 2022-08-03 DIAGNOSIS — I10 Essential (primary) hypertension: Secondary | ICD-10-CM | POA: Diagnosis not present

## 2022-08-03 DIAGNOSIS — Z6835 Body mass index (BMI) 35.0-35.9, adult: Secondary | ICD-10-CM | POA: Diagnosis not present

## 2022-08-03 DIAGNOSIS — F331 Major depressive disorder, recurrent, moderate: Secondary | ICD-10-CM

## 2022-08-03 DIAGNOSIS — Z23 Encounter for immunization: Secondary | ICD-10-CM

## 2022-08-03 DIAGNOSIS — Z136 Encounter for screening for cardiovascular disorders: Secondary | ICD-10-CM

## 2022-08-03 DIAGNOSIS — F411 Generalized anxiety disorder: Secondary | ICD-10-CM

## 2022-08-03 LAB — URINALYSIS, ROUTINE W REFLEX MICROSCOPIC
Bilirubin, UA: NEGATIVE
Glucose, UA: NEGATIVE
Ketones, UA: NEGATIVE
Leukocytes,UA: NEGATIVE
Nitrite, UA: NEGATIVE
Protein,UA: NEGATIVE
RBC, UA: NEGATIVE
Specific Gravity, UA: >1.03 — ABNORMAL HIGH (ref 1.005–1.030)
Urobilinogen, Ur: 0.2 mg/dL (ref 0.2–1.0)
pH, UA: 5.5 (ref 5.0–7.5)

## 2022-08-03 MED ORDER — AMLODIPINE BESYLATE 5 MG PO TABS: 5.0000 mg | ORAL_TABLET | Freq: Every day | ORAL | 1 refills | Status: DC

## 2022-08-03 MED ORDER — LOSARTAN POTASSIUM 100 MG PO TABS: ORAL_TABLET | ORAL | 1 refills | Status: DC

## 2022-08-03 NOTE — Assessment & Plan Note (Signed)
Chronic.  Controlled.  Continue with current medication regimen.  Labs ordered today.  Return to clinic in 6 months for reevaluation.  Call sooner if concerns arise.  ? ?

## 2022-08-03 NOTE — Assessment & Plan Note (Signed)
Recommended eating smaller high protein, low fat meals more frequently and exercising 30 mins a day 5 times a week with a goal of 10-15lb weight loss in the next 3 months. Patient voiced their understanding and motivation to adhere to these recommendations.  

## 2022-08-03 NOTE — Progress Notes (Signed)
BP 132/81   Pulse 71   Temp 98.6 F (37 C) (Oral)   Ht 4' 11.5" (1.511 m)   Wt 167 lb (75.8 kg)   LMP  (LMP Unknown)   SpO2 98%   BMI 33.17 kg/m    Subjective:    Patient ID: Sandra [name], female    DOB: 1960-12-27, 61 y.o.   MRN: [id number]  HPI: Sandra [name] is a 61 y.o. female presenting on 08/03/2022 for comprehensive medical examination. Current medical complaints include:none  She currently lives with: Menopausal Symptoms: no  HYPERTENSION with Chronic Kidney Disease Hypertension status: controlled  Satisfied with current treatment? yes Duration of hypertension: years BP monitoring frequency:  daily BP range: 130/70-80 BP medication side effects:  no Medication compliance: excellent compliance Previous BP meds:amlodipine and losartan (cozaar) Aspirin: no Recurrent headaches: no Visual changes: no Palpitations: no Dyspnea: no Chest pain: no Lower extremity edema: no Dizzy/lightheaded: no  MOOD Patient states she has been more stressed lately with her sister moving here and being really sick.  She is having to help care for her.  Denies SI.    Depression Screen done today and results listed below:     08/03/2022    9:45 AM 05/04/2022    9:45 AM 03/02/2022    3:39 PM 02/01/2022    4:50 PM 12/25/2021    9:13 AM  Depression screen PHQ 2/9  Decreased Interest 0 2 0 0 1  Down, Depressed, Hopeless _0 PHQ - 2 Score _1 Altered sleeping 0 1 0 0 3  Tired, decreased energy _2 Change in appetite _3 Feeling bad or failure about yourself  _4 Trouble concentrating _5 Moving slowly or fidgety/restless 1 0 0 0 0  Suicidal thoughts 0 0 0 0 0  PHQ-9 Score _6 Difficult doing work/chores Somewhat difficult Not difficult at all Not difficult at all Not difficult at all Somewhat difficult    The patient does not have a history of falls. I did complete a risk assessment for falls. A plan of care for falls was  documented.   Past Medical History:  Past Medical History:  Diagnosis Date   Anxiety    Arthritis    Depression    Gout    Hypertension    Obesity     Surgical History:  Past Surgical History:  Procedure Laterality Date   APPENDECTOMY     CESAREAN SECTION     x 2   CHOLECYSTECTOMY  1987    Medications:  Current Outpatient Medications on File Prior to Visit  Medication Sig   colchicine 0.6 MG tablet Take 1 tablet (0.6 mg total) by mouth daily as needed.   cyclobenzaprine (FLEXERIL) 5 MG tablet Take 1 tablet (5 mg total) by mouth at bedtime.   No current facility-administered medications on file prior to visit.    Allergies:  Allergies  Allergen Reactions   Clarithromycin Anaphylaxis and Swelling    Pt took one tablet of biaxin at least 10 years ago and her Tongue immediately swelled. Pt took one tablet of biaxin at least 10 years ago and her Tongue immediately swelled.    Allopurinol Hives and Other (See Comments)   Lisinopril Hives   Prednisone Hives and Other (See Comments)   Other Other (See Comments)    Peanut  butter - trouble swallowing   Penicillins Rash    Social History:  Social History   Socioeconomic History   Marital status: Widowed    Spouse name: Not on file   Number of children: Not on file   Years of education: Not on file   Highest education level: Not on file  Occupational History   Not on file  Tobacco Use   Smoking status: Never   Smokeless tobacco: Never  Vaping Use   Vaping Use: Never used  Substance and Sexual Activity   Alcohol use: No   Drug use: No   Sexual activity: Not Currently  Other Topics Concern   Not on file  Social History Narrative   Not on file   Social Determinants of Health   Financial Resource Strain: Not on file  Food Insecurity: Not on file  Transportation Needs: Not on file  Physical Activity: Not on file  Stress: Not on file  Social Connections: Not on file  Intimate Partner Violence: Not on file    Social History   Tobacco Use  Smoking Status Never  Smokeless Tobacco Never   Social History   Substance and Sexual Activity  Alcohol Use No    Family History:  Family History  Problem Relation Age of Onset   Heart disease Mother    Heart attack Mother    Hypertension Mother    Cancer Mother        breast   Stroke Mother    Heart disease Father    Heart attack Father     Past medical history, surgical history, medications, allergies, family history and social history reviewed with patient today and changes made to appropriate areas of the chart.   Review of Systems  Eyes:  Negative for blurred vision and double vision.  Respiratory:  Negative for shortness of breath.   Cardiovascular:  Negative for chest pain, palpitations and leg swelling.  Neurological:  Negative for dizziness and headaches.  Psychiatric/Behavioral:  Negative for depression and suicidal ideas. The patient is not nervous/anxious.    All other ROS negative except what is listed above and in the HPI.      Objective:    BP 132/81   Pulse 71   Temp 98.6 F (37 C) (Oral)   Ht 4' 11.5" (1.511 m)   Wt 167 lb (75.8 kg)   LMP  (LMP Unknown)   SpO2 98%   BMI 33.17 kg/m   Wt Readings from Last 3 Encounters:  08/03/22 167 lb (75.8 kg)  05/04/22 165 lb (74.8 kg)  03/02/22 163 lb 6.4 oz (74.1 kg)    Physical Exam Vitals and nursing note reviewed.  Constitutional:      General: She is awake. She is not in acute distress.    Appearance: Normal appearance. She is well-developed. She is obese. She is not ill-appearing.  HENT:     Head: Normocephalic and atraumatic.     Right Ear: Hearing, tympanic membrane, ear canal and external ear normal. No drainage.     Left Ear: Hearing, tympanic membrane, ear canal and external ear normal. No drainage.     Nose: Nose normal.     Right Sinus: No maxillary sinus tenderness or frontal sinus tenderness.     Left Sinus: No maxillary sinus tenderness or frontal  sinus tenderness.     Mouth/Throat:     Mouth: Mucous membranes are moist.     Pharynx: Oropharynx is clear. Uvula midline. No pharyngeal swelling, oropharyngeal exudate  or posterior oropharyngeal erythema.  Eyes:     General: Lids are normal.        Right eye: No discharge.        Left eye: No discharge.     Extraocular Movements: Extraocular movements intact.     Conjunctiva/sclera: Conjunctivae normal.     Pupils: Pupils are equal, round, and reactive to light.     Visual Fields: Right eye visual fields normal and left eye visual fields normal.  Neck:     Thyroid: No thyromegaly.     Vascular: No carotid bruit.     Trachea: Trachea normal.  Cardiovascular:     Rate and Rhythm: Normal rate and regular rhythm.     Heart sounds: Normal heart sounds. No murmur heard.    No gallop.  Pulmonary:     Effort: Pulmonary effort is normal. No accessory muscle usage or respiratory distress.     Breath sounds: Normal breath sounds.  Chest:  Breasts:    Right: Normal.     Left: Normal.  Abdominal:     General: Bowel sounds are normal.     Palpations: Abdomen is soft. There is no hepatomegaly or splenomegaly.     Tenderness: There is no abdominal tenderness.  Musculoskeletal:        General: Normal range of motion.     Cervical back: Normal range of motion and neck supple.     Right lower leg: No edema.     Left lower leg: No edema.  Lymphadenopathy:     Head:     Right side of head: No submental, submandibular, tonsillar, preauricular or posterior auricular adenopathy.     Left side of head: No submental, submandibular, tonsillar, preauricular or posterior auricular adenopathy.     Cervical: No cervical adenopathy.     Upper Body:     Right upper body: No supraclavicular, axillary or pectoral adenopathy.     Left upper body: No supraclavicular, axillary or pectoral adenopathy.  Skin:    General: Skin is warm and dry.     Capillary Refill: Capillary refill takes less than 2 seconds.      Findings: No rash.  Neurological:     Mental Status: She is alert and oriented to person, place, and time.     Gait: Gait is intact.  Psychiatric:        Attention and Perception: Attention normal.        Mood and Affect: Mood normal.        Speech: Speech normal.        Behavior: Behavior normal. Behavior is cooperative.        Thought Content: Thought content normal.        Judgment: Judgment normal.     Results for orders placed or performed in visit on 12/25/21  Comp Met (CMET)  Result Value Ref Range   Glucose 92 70 - 99 mg/dL   BUN 10 8 - 27 mg/dL   Creatinine, Ser 0.51 (L) 0.57 - 1.00 mg/dL   eGFR 105 >59 mL/min/1.73   BUN/Creatinine Ratio 20 12 - 28   Sodium 145 (H) 134 - 144 mmol/L   Potassium 3.9 3.5 - 5.2 mmol/L   Chloride 107 (H) 96 - 106 mmol/L   CO2 24 20 - 29 mmol/L   Calcium 8.8 8.7 - 10.3 mg/dL   Total Protein 6.3 6.0 - 8.5 g/dL   Albumin 4.2 3.8 - 4.8 g/dL   Globulin, Total 2.1 1.5 - 4.5 g/dL  Albumin/Globulin Ratio 2.0 1.2 - 2.2   Bilirubin Total 0.7 0.0 - 1.2 mg/dL   Alkaline Phosphatase 78 44 - 121 IU/L   AST 19 0 - 40 IU/L   ALT 17 0 - 32 IU/L  Lipid Profile  Result Value Ref Range   Cholesterol, Total 151 100 - 199 mg/dL   Triglycerides 101 0 - 149 mg/dL   HDL 51 >39 mg/dL   VLDL Cholesterol Cal 19 5 - 40 mg/dL   LDL Chol Calc (NIH) 81 0 - 99 mg/dL   Chol/HDL Ratio 3.0 0.0 - 4.4 ratio      Assessment & Plan:   Problem List Items Addressed This Visit       Cardiovascular and Mediastinum   Essential hypertension   Relevant Medications   amLODipine (NORVASC) 5 MG tablet   losartan (COZAAR) 100 MG tablet     Other   BMI 35.0-35.9,adult    Recommended eating smaller high protein, low fat meals more frequently and exercising 30 mins a day 5 times a week with a goal of 10-15lb weight loss in the next 3 months. Patient voiced their understanding and motivation to adhere to these recommendations.       Generalized anxiety disorder     Chronic.  Controlled.  Continue with current medication regimen.  Labs ordered today.  Return to clinic in 6 months for reevaluation.  Call sooner if concerns arise.        Moderate episode of recurrent major depressive disorder (HCC)    Chronic.  Controlled.  Continue with current medication regimen.  Labs ordered today.  Return to clinic in 6 months for reevaluation.  Call sooner if concerns arise.        Other Visit Diagnoses     Annual physical exam    -  Primary   Health maintenance reviewed during visit today.  Labs ordered.   Relevant Orders   CBC with Differential/Platelet   Comprehensive metabolic panel   TSH   Urinalysis, Routine w reflex microscopic   Screening for ischemic heart disease       Relevant Orders   Lipid panel   Need for influenza vaccination       Relevant Orders   Flu Vaccine QUAD 22moIM (Fluarix, Fluzone & Alfiuria Quad PF)        Follow up plan: Return in about 6 months (around 02/02/2023) for HTN, HLD, DM2 FU.   LABORATORY TESTING:  - Pap smear:  Declined  IMMUNIZATIONS:   - Tdap: Tetanus vaccination status reviewed: last tetanus booster within 10 years. - Influenza: Administered today - Pneumovax: Not applicable - Prevnar: Not applicable - COVID: Not applicable - HPV: Not applicable - Shingrix vaccine: Not applicable  SCREENING: -Mammogram: Ordered today  - Colonoscopy:  Doesn't have transportation   - Bone Density: Not applicable  -Hearing Test: Not applicable  -Spirometry: Not applicable   PATIENT COUNSELING:   Advised to take 1 mg of folate supplement per day if capable of pregnancy.   Sexuality: Discussed sexually transmitted diseases, partner selection, use of condoms, avoidance of unintended pregnancy  and contraceptive alternatives.   Advised to avoid cigarette smoking.  I discussed with the patient that most people either abstain from alcohol or drink within safe limits (<=14/week and <=4 drinks/occasion for males,  <=7/weeks and <= 3 drinks/occasion for females) and that the risk for alcohol disorders and other health effects rises proportionally with the number of drinks per week and how often a drinker exceeds daily  limits.  Discussed cessation/primary prevention of drug use and availability of treatment for abuse.   Diet: Encouraged to adjust caloric intake to maintain  or achieve ideal body weight, to reduce intake of dietary saturated fat and total fat, to limit sodium intake by avoiding high sodium foods and not adding table salt, and to maintain adequate dietary potassium and calcium preferably from fresh fruits, vegetables, and low-fat dairy products.    stressed the importance of regular exercise  Injury prevention: Discussed safety belts, safety helmets, smoke detector, smoking near bedding or upholstery.   Dental health: Discussed importance of regular tooth brushing, flossing, and dental visits.    NEXT PREVENTATIVE PHYSICAL DUE IN 1 YEAR. Return in about 6 months (around 02/02/2023) for HTN, HLD, DM2 FU.

## 2022-08-04 ENCOUNTER — Ambulatory Visit
Admission: RE | Admit: 2022-08-04 | Discharge: 2022-08-04 | Disposition: A | Discharge status: Home / Self Care | Payer: PRIVATE HEALTH INSURANCE | Source: Ambulatory Visit | Attending: Nurse Practitioner | Admitting: Nurse Practitioner

## 2022-08-04 DIAGNOSIS — Z1231 Encounter for screening mammogram for malignant neoplasm of breast: Secondary | ICD-10-CM | POA: Diagnosis present

## 2022-08-04 LAB — COMPREHENSIVE METABOLIC PANEL
ALT: 16 IU/L (ref 0–32)
AST: 18 IU/L (ref 0–40)
Albumin/Globulin Ratio: 2.2 (ref 1.2–2.2)
Albumin: 4.1 g/dL (ref 3.9–4.9)
Alkaline Phosphatase: 71 IU/L (ref 44–121)
BUN/Creatinine Ratio: 15 (ref 12–28)
BUN: 9 mg/dL (ref 8–27)
Bilirubin Total: 0.7 mg/dL (ref 0.0–1.2)
CO2: 22 mmol/L (ref 20–29)
Calcium: 9 mg/dL (ref 8.7–10.3)
Chloride: 110 mmol/L — ABNORMAL HIGH (ref 96–106)
Creatinine, Ser: 0.6 mg/dL (ref 0.57–1.00)
Globulin, Total: 1.9 g/dL (ref 1.5–4.5)
Glucose: 90 mg/dL (ref 70–99)
Potassium: 3.7 mmol/L (ref 3.5–5.2)
Sodium: 145 mmol/L — ABNORMAL HIGH (ref 134–144)
Total Protein: 6 g/dL (ref 6.0–8.5)
eGFR: 101 mL/min/{1.73_m2} (ref >59)

## 2022-08-04 LAB — CBC WITH DIFFERENTIAL/PLATELET
Basophils Absolute: 0 10*3/uL (ref 0.0–0.2)
Basos: 1 %
EOS (ABSOLUTE): 0.1 10*3/uL (ref 0.0–0.4)
Eos: 2 %
Hematocrit: 37.6 % (ref 34.0–46.6)
Hemoglobin: 12.9 g/dL (ref 11.1–15.9)
Immature Grans (Abs): 0 10*3/uL (ref 0.0–0.1)
Immature Granulocytes: 0 %
Lymphocytes Absolute: 1.6 10*3/uL (ref 0.7–3.1)
Lymphs: 28 %
MCH: 29.2 pg (ref 26.6–33.0)
MCHC: 34.3 g/dL (ref 31.5–35.7)
MCV: 85 fL (ref 79–97)
Monocytes Absolute: 0.4 10*3/uL (ref 0.1–0.9)
Monocytes: 8 %
Neutrophils Absolute: 3.4 10*3/uL (ref 1.4–7.0)
Neutrophils: 61 %
Platelets: 212 10*3/uL (ref 150–450)
RBC: 4.42 x10E6/uL (ref 3.77–5.28)
RDW: 12.8 % (ref 11.7–15.4)
WBC: 5.6 10*3/uL (ref 3.4–10.8)

## 2022-08-04 LAB — LIPID PANEL
Chol/HDL Ratio: 2.9 ratio (ref 0.0–4.4)
Cholesterol, Total: 140 mg/dL (ref 100–199)
HDL: 49 mg/dL (ref >39)
LDL Chol Calc (NIH): 72 mg/dL (ref 0–99)
Triglycerides: 105 mg/dL (ref 0–149)
VLDL Cholesterol Cal: 19 mg/dL (ref 5–40)

## 2022-08-04 LAB — TSH: TSH: 3.23 u[IU]/mL (ref 0.450–4.500)

## 2022-08-06 ENCOUNTER — Other Ambulatory Visit: Payer: Self-pay | Admitting: *Deleted

## 2022-08-06 ENCOUNTER — Inpatient Hospital Stay
Admission: RE | Admit: 2022-08-06 | Discharge: 2022-08-06 | Disposition: A | Discharge status: Home / Self Care | Payer: Self-pay | Source: Ambulatory Visit | Attending: *Deleted | Admitting: *Deleted

## 2022-08-06 DIAGNOSIS — Z1231 Encounter for screening mammogram for malignant neoplasm of breast: Secondary | ICD-10-CM

## 2022-08-06 NOTE — Progress Notes (Signed)
Please let patient know her Mammogram did not show any evidence of a malignancy.  The recommendation is to repeat the Mammogram in 1 year.  

## 2022-10-04 ENCOUNTER — Telehealth: Payer: Self-pay

## 2022-10-04 NOTE — Telephone Encounter (Signed)
Reviewed patient's chart. Do not see any documentation regarding weight loss medication at last office visit. Routing to provider to advise.

## 2022-10-04 NOTE — Telephone Encounter (Signed)
Copied from [place] [id number]. Topic: General - Other >> Oct 01, 2022  3:42 PM Everette C wrote: Reason for CRM: The patient has called to follow up on previous discussion related to their prescriptions with their PCP   The patient would like to know whether they will be prescribed Ozempic or Wegovy   Please contact further when possible

## 2022-10-04 NOTE — Telephone Encounter (Signed)
Ozempic is not an option due to not being diabetic.  However, if she would like to start wegovy she can come back and discuss at a visit.  I do recommend she call her insurance company to make sure they will approve weight loss medication prior to the visit so she doesn't waste her time.  Also, it is still very hard to get a hold of but we can definitely try to get it started.

## 2022-10-05 NOTE — Telephone Encounter (Signed)
Called and LVM asking for patient to please return my call.   OK for PEC to give providers message if the patient calls back.

## 2022-10-06 NOTE — Telephone Encounter (Signed)
Called and LVM asking for patient to please return my call.    OK for PEC to give providers message if the patient calls back.

## 2023-02-02 ENCOUNTER — Ambulatory Visit: Payer: PRIVATE HEALTH INSURANCE | Admitting: Nurse Practitioner

## 2023-02-07 ENCOUNTER — Other Ambulatory Visit: Payer: Self-pay | Admitting: Nurse Practitioner

## 2023-02-08 NOTE — Telephone Encounter (Signed)
Requested Prescriptions  Pending Prescriptions Disp Refills   amLODipine (NORVASC) 5 MG tablet [Pharmacy Med Name: amLODIPine Besylate 5 MG Oral Tablet] 90 tablet 0    Sig: Take 1 tablet by mouth once daily     Cardiovascular: Calcium Channel Blockers 2 Failed - 02/07/2023  3:30 PM      Failed - Valid encounter within last 6 months    Recent Outpatient Visits           6 months ago Annual physical exam   [place] [facility] Larae Grooms, NP   9 months ago Moderate episode of recurrent major depressive disorder [facility])   [place] [facility] Larae Grooms, NP   11 months ago Essential hypertension   [place] [facility] Larae Grooms, NP   1 year ago Essential hypertension   [place] [facility] Mecum, Oswaldo Conroy, PA-C   1 year ago Essential hypertension   [place] [facility] Larae Grooms, NP              Passed - Last BP in normal range    BP Readings from Last 1 Encounters:  08/03/22 132/81         Passed - Last Heart Rate in normal range    Pulse Readings from Last 1 Encounters:  08/03/22 71

## 2023-03-01 ENCOUNTER — Other Ambulatory Visit: Payer: Self-pay | Admitting: Nurse Practitioner

## 2023-03-01 NOTE — Telephone Encounter (Signed)
Medication Refill - Medication: losartan (COZAAR) 100 MG tablet   Has the patient contacted their pharmacy? Yes.      Preferred Pharmacy (with phone number or street name):  [facility] [address], [place] - [address] [place] [address] Phone: [phone]  Fax: [phone]     Has the patient been seen for an appointment in the last year OR does the patient have an upcoming appointment? Yes.    Agent: Please be advised that RX refills may take up to 3 business days. We ask that you follow-up with your pharmacy.

## 2023-03-02 NOTE — Telephone Encounter (Signed)
Rx refilled 03/02/23, duplicate request.  Requested Prescriptions  Pending Prescriptions Disp Refills   losartan (COZAAR) 100 MG tablet 90 tablet 1    Sig: SMARTSIG:1 Tablet(s) By Mouth Every Evening     Cardiovascular:  Angiotensin Receptor Blockers Failed - 03/01/2023  2:56 PM      Failed - Cr in normal range and within 180 days    Creatinine  Date Value Ref Range Status  09/11/2014 0.74 0.60 - 1.30 mg/dL Final   Creatinine, Ser  Date Value Ref Range Status  08/03/2022 0.60 0.57 - 1.00 mg/dL Final         Failed - K in normal range and within 180 days    Potassium  Date Value Ref Range Status  08/03/2022 3.7 3.5 - 5.2 mmol/L Final  09/11/2014 4.4 3.5 - 5.1 mmol/L Final         Failed - Valid encounter within last 6 months    Recent Outpatient Visits           7 months ago Annual physical exam   [place] [facility] Larae Grooms, NP   10 months ago Moderate episode of recurrent major depressive disorder [facility])   [place] [facility] Larae Grooms, NP   1 year ago Essential hypertension   [place] [facility] Larae Grooms, NP   1 year ago Essential hypertension   [place] [facility] Mecum, Oswaldo Conroy, PA-C   1 year ago Essential hypertension   [place] [facility] Larae Grooms, NP       Future Appointments             In 1 week Mecum, Oswaldo Conroy, PA-C [place] [facility], [facility]            Passed - Patient is not pregnant      Passed - Last BP in normal range    BP Readings from Last 1 Encounters:  08/03/22 132/81

## 2023-03-02 NOTE — Telephone Encounter (Signed)
Requested Prescriptions  Pending Prescriptions Disp Refills   losartan (COZAAR) 100 MG tablet [Pharmacy Med Name: Losartan Potassium 100 MG Oral Tablet] 90 tablet 0    Sig: Take 1 tablet by mouth in the evening     Cardiovascular:  Angiotensin Receptor Blockers Failed - 03/01/2023  2:15 PM      Failed - Cr in normal range and within 180 days    Creatinine  Date Value Ref Range Status  09/11/2014 0.74 0.60 - 1.30 mg/dL Final   Creatinine, Ser  Date Value Ref Range Status  08/03/2022 0.60 0.57 - 1.00 mg/dL Final         Failed - K in normal range and within 180 days    Potassium  Date Value Ref Range Status  08/03/2022 3.7 3.5 - 5.2 mmol/L Final  09/11/2014 4.4 3.5 - 5.1 mmol/L Final         Failed - Valid encounter within last 6 months    Recent Outpatient Visits           7 months ago Annual physical exam   [place] [facility] Larae Grooms, NP   10 months ago Moderate episode of recurrent major depressive disorder [facility])   [place] [facility] Larae Grooms, NP   1 year ago Essential hypertension   [place] [facility] Larae Grooms, NP   1 year ago Essential hypertension   [place] [facility] Mecum, Oswaldo Conroy, PA-C   1 year ago Essential hypertension   [place] [facility] Larae Grooms, NP       Future Appointments             In 1 week Mecum, Oswaldo Conroy, PA-C [place] [facility], [facility]            Passed - Patient is not pregnant      Passed - Last BP in normal range    BP Readings from Last 1 Encounters:  08/03/22 132/81

## 2023-03-14 ENCOUNTER — Ambulatory Visit: Payer: PRIVATE HEALTH INSURANCE | Admitting: Physician Assistant

## 2023-03-14 ENCOUNTER — Encounter: Payer: Self-pay | Admitting: Physician Assistant

## 2023-03-14 VITALS — BP 117/76 | HR 75 | Ht 59.5 in | Wt 168.4 lb

## 2023-03-14 DIAGNOSIS — K219 Gastro-esophageal reflux disease without esophagitis: Secondary | ICD-10-CM

## 2023-03-14 DIAGNOSIS — Z6835 Body mass index (BMI) 35.0-35.9, adult: Secondary | ICD-10-CM

## 2023-03-14 DIAGNOSIS — F411 Generalized anxiety disorder: Secondary | ICD-10-CM

## 2023-03-14 DIAGNOSIS — I10 Essential (primary) hypertension: Secondary | ICD-10-CM

## 2023-03-14 DIAGNOSIS — F331 Major depressive disorder, recurrent, moderate: Secondary | ICD-10-CM | POA: Diagnosis not present

## 2023-03-14 MED ORDER — AMLODIPINE BESYLATE 5 MG PO TABS: 5.0000 mg | ORAL_TABLET | Freq: Every day | ORAL | 0 refills | Status: DC

## 2023-03-14 MED ORDER — LOSARTAN POTASSIUM 100 MG PO TABS: ORAL_TABLET | ORAL | 0 refills | Status: DC

## 2023-03-14 MED ORDER — SERTRALINE HCL 50 MG PO TABS: 50.0000 mg | ORAL_TABLET | Freq: Every day | ORAL | 1 refills | Status: DC

## 2023-03-14 NOTE — Patient Instructions (Addendum)
To help with your reflux symptoms I recommend the following: You can try taking a medication called Pepcid every day to help prevent heartburn and reflux. This along with dietary changes (avoiding trigger foods and aggravating foods/ beverages) can help reduce symptoms   Please call your insurance company and ask if they cover Zepbound and/or Wegovy for weight loss.   Please start your Zoloft at a half tablet for the first 6-8 days. After this you can increase to a whole tablet once per day

## 2023-03-14 NOTE — Assessment & Plan Note (Signed)
Chronic, historic condition Appears well controlled at this time with current regimen of Amlodipine 5 mg PO every day and Losartan 100 mg PO every day  Refills provided today Continue current regimen and continue checking BP daily to monitor at home Recommend she continues with current diet and exercise efforts Follow up in 6 months or sooner if concerns arise

## 2023-03-14 NOTE — Progress Notes (Signed)
Established Patient Office Visit  Name: Sandra [name]   MRN: [id number]    DOB: Sep 21, 1960   Date:03/16/2023  Today's Provider: Jacquelin Hawking, MHS, PA-C Introduced myself to the patient as a PA-C and provided education on APPs in clinical practice.         Subjective  Chief Complaint  Chief Complaint  Patient presents with   Hyperlipidemia   Hypertension   Diabetes   Gastroesophageal Reflux    Patient says after she eats, she is noticing that she throwing up heavy mucus. Patient says she has tried Tums Plus Gas over the counter medication. Patient says she does not feel better unless she spits up the mucus. Patient would like to discuss different treatment options.    Weight Gain    Patient says she would like to discuss moving forward with weight loss injections.     HPI   Hypertension: - Medications: Losartan and Amlodipine  - Compliance: good  - Checking BP at home: checking at work - 120s/ 70s on avg  - Denies any SOB, CP, vision changes, LE edema, medication SEs, or symptoms of hypotension - Diet: She is struggling with GERD when she eats  - Exercise: She was going to the gym but has struggled with maintaining regimen due to heat    GERD GERD control status: uncontrolled Satisfied with current treatment? no Heartburn frequency: daily  Medication side effects: NA   Medication compliance: NA Previous GERD medications: she has not taken anything for prevention.  Reports current symptoms started about 2 months ago  Antacid use frequency:  daily, using TUMS plus gas  Duration: several hours  Nature: reports feeling like there is a thick mucus or foam she has to cough up  Alleviatiating factors: nothing so far has helped  Aggravating factors: greasy foods seem to aggravate the most  Dysphagia: no Odynophagia:  no Hematemesis: no Blood in stool: no EGD: unsure if she has had one    DEPRESSION She reports previous medication regimen but is not sure of what she  was on   Mood status: uncontrolled Satisfied with current treatment?:  not on treatment  Symptom severity: moderate  Duration of current treatment : NA Side effects: NA Medication compliance: NA Psychotherapy/counseling:  she is working on getting a therapist   but is struggling due to need for referral  Previous psychiatric medications: none  Depressed mood: yes Anxious mood: yes Anhedonia: yes She reports increased worry especially at work- 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  Significant weight loss or gain: no Insomnia: no  Fatigue: no Feelings of worthlessness or guilt: no Impaired concentration/indecisiveness: no Suicidal ideations: no Hopelessness: no Crying spells: no    03/14/2023    4:51 PM 08/03/2022    9:45 AM 05/04/2022    9:45 AM 03/02/2022    3:39 PM 02/01/2022    4:50 PM  Depression screen PHQ 2/9  Decreased Interest 2 0 2 0 0  Down, Depressed, Hopeless 2 1 2 1 1   PHQ - 2 Score 4 1 4 1 1   Altered sleeping 1 0 1 0 0  Tired, decreased energy 3 2 1 1 2   Change in appetite 3 3 3 2 2   Feeling bad or failure about yourself  3 1 1 1 2   Trouble concentrating 3 1 1 1 1   Moving slowly or fidgety/restless 0 1 0 0 0  Suicidal thoughts  0 0 0 0 0  PHQ-9 Score 17 9 11 6 8   Difficult doing work/chores Somewhat difficult Somewhat difficult Not difficult at all Not difficult at all Not difficult at all      03/14/2023    4:51 PM 08/03/2022    9:48 AM 05/04/2022    9:46 AM 03/02/2022    3:39 PM  GAD 7 : Generalized Anxiety Score  Nervous, Anxious, on Edge 3 3 1 1   Control/stop worrying 3 3 3 1   Worry too much - different things 3 3 3 1   Trouble relaxing 2 3 3 1   Restless 3 3 3 1   Easily annoyed or irritable 3 3 3 1   Afraid - awful might happen 2 3 3 1   Total GAD 7 Score 19 21 19 7   Anxiety Difficulty Somewhat difficult Somewhat difficult Not difficult at all Not difficult at all    Weight concerns Reports she is having  trouble with weight and diet States she has tried going to weight loss center but is not sure what kind of therapies were being initiated  She is very interested in starting an injectable     Patient Active Problem List   Diagnosis Date Noted   Gastroesophageal reflux disease 03/16/2023   Abdominal gas pain 08/21/2021   Generalized anxiety disorder 10/23/2020   Moderate episode of recurrent major depressive disorder (HCC) 10/23/2020   Essential hypertension 02/07/2020   Idiopathic chronic gout of multiple sites without tophus 08/06/2019   BMI 35.0-35.9,adult 01/19/2019   Status post bariatric surgery 09/13/2018   Gout 09/07/2018    Past Surgical History:  Procedure Laterality Date   APPENDECTOMY     CESAREAN SECTION     x 2   CHOLECYSTECTOMY  1987    Family History  Problem Relation Age of Onset   Breast cancer Mother 67   Heart disease Mother    Heart attack Mother    Hypertension Mother    Cancer Mother        breast   Stroke Mother    Heart disease Father    Heart attack Father     Social History   Tobacco Use   Smoking status: Never   Smokeless tobacco: Never  Substance Use Topics   Alcohol use: No     Current Outpatient Medications:    colchicine 0.6 MG tablet, Take 1 tablet (0.6 mg total) by mouth daily as needed., Disp: 30 tablet, Rfl: 1   sertraline (ZOLOFT) 50 MG tablet, Take 1 tablet (50 mg total) by mouth daily. Take half tablet (25 mg )  for one week then increase to whole tablet (50 mg) per day from there., Disp: 30 tablet, Rfl: 1   amLODipine (NORVASC) 5 MG tablet, Take 1 tablet (5 mg total) by mouth daily., Disp: 90 tablet, Rfl: 0   losartan (COZAAR) 100 MG tablet, Take 1 tablet by mouth in the evening, Disp: 90 tablet, Rfl: 0  Allergies  Allergen Reactions   Clarithromycin Anaphylaxis and Swelling    Pt took one tablet of biaxin at least 10 years ago and her Tongue immediately swelled. Pt took one tablet of biaxin at least 10 years ago and  her Tongue immediately swelled.    Allopurinol Hives and Other (See Comments)   Lisinopril Hives   Prednisone Hives and Other (See Comments)   Other Other (See Comments)    Peanut butter - trouble swallowing   Penicillins Rash    I personally reviewed active problem list, medication  list, allergies, health maintenance, notes from last encounter, lab results with the patient/caregiver today.   Review of Systems  Eyes:  Negative for blurred vision and double vision.  Respiratory:  Negative for shortness of breath and wheezing.   Cardiovascular:  Negative for chest pain, palpitations and leg swelling.  Gastrointestinal:  Positive for heartburn.  Neurological:  Negative for dizziness and headaches.  Psychiatric/Behavioral:  Positive for depression. Negative for memory loss and suicidal ideas. The patient is nervous/anxious. The patient does not have insomnia.       Objective  Vitals:   03/14/23 1458  BP: 117/76  Pulse: 75  SpO2: 97%  Weight: 168 lb 6.4 oz (76.4 kg)  Height: 4' 11.5" (1.511 m)    Body mass index is 33.44 kg/m.  Physical Exam Vitals reviewed.  Constitutional:      General: She is awake.     Appearance: Normal appearance. She is well-developed and well-groomed.  HENT:     Head: Normocephalic and atraumatic.  Cardiovascular:     Rate and Rhythm: Normal rate and regular rhythm.     Pulses: Normal pulses.          Radial pulses are 2+ on the right side and 2+ on the left side.     Heart sounds: Normal heart sounds. No murmur heard.    No friction rub. No gallop.  Pulmonary:     Effort: Pulmonary effort is normal.  Musculoskeletal:     Cervical back: Normal range of motion.     Right lower leg: No edema.     Left lower leg: No edema.  Neurological:     General: No focal deficit present.     Mental Status: She is alert and oriented to person, place, and time.     GCS: GCS eye subscore is 4. GCS verbal subscore is 5. GCS motor subscore is 6.      Cranial Nerves: No dysarthria or facial asymmetry.     Gait: Gait is intact.  Psychiatric:        Attention and Perception: Attention and perception normal.        Mood and Affect: Mood and affect normal.        Speech: Speech normal.        Behavior: Behavior normal. Behavior is cooperative.        Thought Content: Thought content normal.        Cognition and Memory: Cognition normal.      No results found for this or any previous visit (from the past 2160 hour(s)).   PHQ2/9:    03/14/2023    4:51 PM 08/03/2022    9:45 AM 05/04/2022    9:45 AM 03/02/2022    3:39 PM 02/01/2022    4:50 PM  Depression screen PHQ 2/9  Decreased Interest 2 0 2 0 0  Down, Depressed, Hopeless 2 1 2 1 1   PHQ - 2 Score 4 1 4 1 1   Altered sleeping 1 0 1 0 0  Tired, decreased energy 3 2 1 1 2   Change in appetite 3 3 3 2 2   Feeling bad or failure about yourself  3 1 1 1 2   Trouble concentrating 3 1 1 1 1   Moving slowly or fidgety/restless 0 1 0 0 0  Suicidal thoughts 0 0 0 0 0  PHQ-9 Score 17 9 11 6 8   Difficult doing work/chores Somewhat difficult Somewhat difficult Not difficult at all Not difficult at all Not difficult at all  Fall Risk:    03/14/2023    4:51 PM 08/03/2022    9:45 AM 05/04/2022    9:45 AM 03/02/2022    3:39 PM 02/01/2022    4:50 PM  Fall Risk   Falls in the past year? 0 0 0 0 0  Number falls in past yr: 0 0 0 0 0  Injury with Fall? 0 0 0 0 0  Risk for fall due to : No Fall Risks No Fall Risks No Fall Risks No Fall Risks No Fall Risks  Follow up Falls evaluation completed Falls evaluation completed Falls evaluation completed Falls evaluation completed Falls evaluation completed      Functional Status Survey:      Assessment & Plan  Problem List Items Addressed This Visit       Cardiovascular and Mediastinum   Essential hypertension    Chronic, historic condition Appears well controlled at this time with current regimen of Amlodipine 5 mg PO every day and  Losartan 100 mg PO every day  Refills provided today Continue current regimen and continue checking BP daily to monitor at home Recommend she continues with current diet and exercise efforts Follow up in 6 months or sooner if concerns arise        Relevant Medications   amLODipine (NORVASC) 5 MG tablet   losartan (COZAAR) 100 MG tablet     Digestive   Gastroesophageal reflux disease    Newly reported concern, likely chronic Recommend she tries Pepcid and avoiding trigger foods to assist with symptoms Can use TUMS or Pepto as needed for acute flares  May need to escalate to Prilosec if not fully controlled with Pepcid Follow up as needed for persistent or progressing symptoms          Other   BMI 35.0-35.9,adult    Chronic, ongoing She reports she is having difficulty with dieting and maintaining weight loss despite trying to reduce portion sizes and meal frequency She is interested in trying a medication to assist with this - recommend she tries to call her insurance company to assess coverage for Zepbound and/or Wegovy specifically prior to sending in script  Recommend she continues with diet and exercise efforts while awaiting insurance response Will provide script if there is coverage.  Follow up in 2 months for monitoring        Generalized anxiety disorder    Chronic, currently uncontrolled  She Is amenable to starting medication at this time  Will send in script for Zoloft 50 mg PO every day - recommend starting at 25 mg PO every day for the first week then can increase to full tablet after this  Recommend follow up in 2 months to assess response       Relevant Medications   sertraline (ZOLOFT) 50 MG tablet   Other Relevant Orders   Ambulatory referral to Psychology   Moderate episode of recurrent major depressive disorder (HCC) - Primary    Chronic, ongoing condition She is not currently on medication regimen  Discussed potential medication options to assist  with symptom management - discussed SSRI vs SNRI therapies but recommend SSRI due to excessive anxiety scores on GAD7 She is amenable to starting Zoloft today Will start at 25 mg PO every day for one week then increase to 50 mg PO every day  Follow up in about 2 months to assess response or sooner if concerns arise        Relevant Medications   sertraline (ZOLOFT) 50 MG  tablet   Other Relevant Orders   Ambulatory referral to Psychology     Return in about 2 months (around 05/14/2023) for HTN, Depression, Gerd, Weight management .   I, Samel Bruna E Kimaya Whitlatch, PA-C, have reviewed all documentation for this visit. The documentation on 03/16/23 for the exam, diagnosis, procedures, and orders are all accurate and complete.   Jacquelin Hawking, MHS, PA-C [facility]

## 2023-03-14 NOTE — Assessment & Plan Note (Addendum)
Chronic, ongoing She reports she is having difficulty with dieting and maintaining weight loss despite trying to reduce portion sizes and meal frequency She is interested in trying a medication to assist with this - recommend she tries to call her insurance company to assess coverage for Zepbound and/or Wegovy specifically prior to sending in script  Recommend she continues with diet and exercise efforts while awaiting insurance response Will provide script if there is coverage.  Follow up in 2 months for monitoring

## 2023-03-14 NOTE — Assessment & Plan Note (Signed)
Chronic, ongoing condition She is not currently on medication regimen  Discussed potential medication options to assist with symptom management - discussed SSRI vs SNRI therapies but recommend SSRI due to excessive anxiety scores on GAD7 She is amenable to starting Zoloft today Will start at 25 mg PO every day for one week then increase to 50 mg PO every day  Follow up in about 2 months to assess response or sooner if concerns arise

## 2023-03-16 DIAGNOSIS — K219 Gastro-esophageal reflux disease without esophagitis: Secondary | ICD-10-CM | POA: Insufficient documentation

## 2023-03-16 NOTE — Assessment & Plan Note (Signed)
Newly reported concern, likely chronic Recommend she tries Pepcid and avoiding trigger foods to assist with symptoms Can use TUMS or Pepto as needed for acute flares  May need to escalate to Prilosec if not fully controlled with Pepcid Follow up as needed for persistent or progressing symptoms

## 2023-03-16 NOTE — Assessment & Plan Note (Signed)
Chronic, currently uncontrolled  She Is amenable to starting medication at this time  Will send in script for Zoloft 50 mg PO every day - recommend starting at 25 mg PO every day for the first week then can increase to full tablet after this  Recommend follow up in 2 months to assess response

## 2023-05-23 ENCOUNTER — Ambulatory Visit: Payer: PRIVATE HEALTH INSURANCE | Admitting: Nurse Practitioner

## 2023-06-15 ENCOUNTER — Ambulatory Visit: Payer: PRIVATE HEALTH INSURANCE | Admitting: Nurse Practitioner

## 2023-06-28 ENCOUNTER — Ambulatory Visit: Payer: PRIVATE HEALTH INSURANCE | Admitting: Family Medicine

## 2023-06-28 VITALS — BP 140/88 | HR 65 | Temp 98.3°F | Ht 60.63 in | Wt 171.4 lb

## 2023-06-28 DIAGNOSIS — F411 Generalized anxiety disorder: Secondary | ICD-10-CM | POA: Diagnosis not present

## 2023-06-28 DIAGNOSIS — I10 Essential (primary) hypertension: Secondary | ICD-10-CM | POA: Diagnosis not present

## 2023-06-28 DIAGNOSIS — K219 Gastro-esophageal reflux disease without esophagitis: Secondary | ICD-10-CM | POA: Diagnosis not present

## 2023-06-28 DIAGNOSIS — Z6835 Body mass index (BMI) 35.0-35.9, adult: Secondary | ICD-10-CM | POA: Diagnosis not present

## 2023-06-28 MED ORDER — LOSARTAN POTASSIUM 100 MG PO TABS: ORAL_TABLET | ORAL | 0 refills | Status: DC

## 2023-06-28 MED ORDER — PANTOPRAZOLE SODIUM 20 MG PO TBEC: 20.0000 mg | DELAYED_RELEASE_TABLET | Freq: Every day | ORAL | 1 refills | Status: DC

## 2023-06-28 MED ORDER — AMLODIPINE BESYLATE 5 MG PO TABS: 5.0000 mg | ORAL_TABLET | Freq: Every day | ORAL | 0 refills | Status: DC

## 2023-06-28 NOTE — Patient Instructions (Addendum)
Call insurance first to find out if they will cover this for you: Wegovy Zepbound For weight management.

## 2023-06-28 NOTE — Progress Notes (Signed)
BP (!) 140/88   Pulse 65   Temp 98.3 F (36.8 C) (Oral)   Ht 5' 0.63" (1.54 m)   Wt 171 lb 6.4 oz (77.7 kg)   LMP  (LMP Unknown)   SpO2 97%   BMI 32.78 kg/m    Subjective:    Patient ID: Sandra [name], female    DOB: July 30, 1961, 62 y.o.   MRN: [id number]  HPI: Sandra [name] is a 62 y.o. female  Chief Complaint  Patient presents with   Gastroesophageal Reflux   Hypertension   COPD   Obesity   HYPERTENSION without Chronic Kidney Disease Taking Amlodipine 5 MG and Losartan 100 MG Hypertension status: controlled  Satisfied with current treatment? no Duration of hypertension: chronic BP monitoring frequency:  weekly BP range: 130/80 BP medication side effects:  no Medication compliance: excellent compliance Aspirin: no Recurrent headaches: no Visual changes: no Palpitations: no Dyspnea: no Chest pain: no Lower extremity edema: no Dizzy/lightheaded: no   GERD Reports current symptoms started about 5 months ago She is avoiding greasy, fried, and spicy foods. GERD control status: uncontrolled Satisfied with current treatment? no Heartburn frequency: daily  Medication side effects: NA   Medication compliance: NA Previous GERD medications: otc TUMS daily  Antacid use frequency:  daily, using 2 TUMS w gas Duration: several hours  Nature: reports feeling like there is a thick mucus or foam she has to cough up and spit up  Alleviatiating factors: Nothing  Aggravating factors: greasy, spicy, and fried. Dysphagia: no Odynophagia:  no Hematemesis: no Blood in stool: no EGD: No    DEPRESSION She was currently taking Zoloft 50 MG, but states she is ready to come off of this. She started to wean herself off of the medication 2 weeks ago and has not taken any in the past 5 days. Spends times with family for relaxation and comfort, after the loss of her sister this year.  Mood status: stable Satisfied with current treatment: discontinue Zoloft Symptom severity: None Duration  of current treatment : 3 months Side effects: None Psychotherapy/counseling: She is working on setting up an appointment for January 2025 when insurance goes into effect. Plan on setting this with [facility].  Previous psychiatric medications: none  Depressed mood: yes Anxious mood: yes Anhedonia: yes Significant weight loss or gain: no  Insomnia: no  Fatigue: no Feelings of worthlessness or guilt: no Impaired concentration/indecisiveness: no Suicidal ideations: no Hopelessness: no Crying spells: no    06/28/2023    2:31 PM 03/14/2023    4:51 PM 08/03/2022    9:48 AM 05/04/2022    9:46 AM  GAD 7 : Generalized Anxiety Score  Nervous, Anxious, on Edge 0 3 3 1   Control/stop worrying 3 3 3 3   Worry too much - different things 0 3 3 3   Trouble relaxing 0 2 3 3   Restless 0 3 3 3   Easily annoyed or irritable 0 3 3 3   Afraid - awful might happen 0 2 3 3   Total GAD 7 Score 3 19 21 19   Anxiety Difficulty Not difficult at all Somewhat difficult Somewhat difficult Not difficult at all        06/28/2023    2:31 PM 03/14/2023    4:51 PM 08/03/2022    9:45 AM 05/04/2022    9:45 AM 03/02/2022    3:39 PM  Depression screen PHQ 2/9  Decreased Interest 1 2 0 2 0  Down, Depressed, Hopeless 0 2 1 2 1   PHQ -  2 Score 1 4 1 4 1   Altered sleeping 0 1 0 1 0  Tired, decreased energy 0 3 2 1 1   Change in appetite 3 3 3 3 2   Feeling bad or failure about yourself  0 3 1 1 1   Trouble concentrating 0 3 1 1 1   Moving slowly or fidgety/restless 0 0 1 0 0  Suicidal thoughts 0 0 0 0 0  PHQ-9 Score 4 17 9 11 6   Difficult doing work/chores Not difficult at all Somewhat difficult Somewhat difficult Not difficult at all Not difficult at all     WEIGHT GAIN BMI at 32.78 kg/m; weight concerns with interest in starting [facility].  She is going to the gym x3 weekly for 1 hour 45 minutes. Her diet consist of x3 meals daily with fruits, vegetables, protein includes chicken and beef mostly, no pork,  avoids sodas. Not so great with managing sweets. Avoids eating at fast foods restaurants.  Duration: 1 year  Previous attempts at weight loss: Bariatric surgery 2020 Complications of obesity: HTN Peak weight: 266 Weight loss goal: 130 Weight loss to date: 0 Requesting obesity pharmacotherapy: yes Current weight loss supplements/medications: no Previous weight loss supplements/meds: no  Relevant past medical, surgical, family and social history reviewed and updated as indicated. Interim medical history since our last visit reviewed. Allergies and medications reviewed and updated.  Review of Systems  Eyes:  Negative for visual disturbance.  Respiratory: Negative.  Negative for shortness of breath.   Cardiovascular: Negative.  Negative for chest pain, palpitations and leg swelling.  Gastrointestinal:  Negative for nausea.  Neurological:  Negative for dizziness, light-headedness and headaches.  Psychiatric/Behavioral:  Negative for agitation, behavioral problems, confusion, decreased concentration, dysphoric mood, sleep disturbance and suicidal ideas. The patient is nervous/anxious.     Per HPI unless specifically indicated above     Objective:    BP (!) 140/88   Pulse 65   Temp 98.3 F (36.8 C) (Oral)   Ht 5' 0.63" (1.54 m)   Wt 171 lb 6.4 oz (77.7 kg)   LMP  (LMP Unknown)   SpO2 97%   BMI 32.78 kg/m   Wt Readings from Last 3 Encounters:  06/28/23 171 lb 6.4 oz (77.7 kg)  03/14/23 168 lb 6.4 oz (76.4 kg)  08/03/22 167 lb (75.8 kg)    Physical Exam Vitals and nursing note reviewed.  Constitutional:      General: She is awake. She is not in acute distress.    Appearance: Normal appearance. She is well-developed and well-groomed. She is obese. She is not ill-appearing.  HENT:     Head: Normocephalic and atraumatic.     Right Ear: Hearing and external ear normal. No drainage.     Left Ear: Hearing and external ear normal. No drainage.     Nose: Nose normal.  Eyes:      General: Lids are normal.        Right eye: No discharge.        Left eye: No discharge.     Conjunctiva/sclera: Conjunctivae normal.  Cardiovascular:     Rate and Rhythm: Normal rate and regular rhythm.     Pulses:          Radial pulses are 2+ on the right side and 2+ on the left side.       Posterior tibial pulses are 2+ on the right side and 2+ on the left side.     Heart sounds: Normal heart sounds, S1  normal and S2 normal. No murmur heard.    No gallop.  Pulmonary:     Effort: Pulmonary effort is normal. No accessory muscle usage or respiratory distress.     Breath sounds: Normal breath sounds.  Musculoskeletal:        General: Normal range of motion.     Cervical back: Full passive range of motion without pain and normal range of motion.     Right lower leg: No edema.     Left lower leg: No edema.  Skin:    General: Skin is warm and dry.     Capillary Refill: Capillary refill takes less than 2 seconds.  Neurological:     Mental Status: She is alert and oriented to person, place, and time.  Psychiatric:        Attention and Perception: Attention normal.        Mood and Affect: Mood normal.        Speech: Speech normal.        Behavior: Behavior normal. Behavior is cooperative.        Thought Content: Thought content normal.     Results for orders placed or performed in visit on 08/03/22  CBC with Differential/Platelet  Result Value Ref Range   WBC 5.6 3.4 - 10.8 x10E3/uL   RBC 4.42 3.77 - 5.28 x10E6/uL   Hemoglobin 12.9 11.1 - 15.9 g/dL   Hematocrit 16.1 09.6 - 46.6 %   MCV 85 79 - 97 fL   MCH 29.2 26.6 - 33.0 pg   MCHC 34.3 31.5 - 35.7 g/dL   RDW 04.5 40.9 - 81.1 %   Platelets 212 150 - 450 x10E3/uL   Neutrophils 61 Not Estab. %   Lymphs 28 Not Estab. %   Monocytes 8 Not Estab. %   Eos 2 Not Estab. %   Basos 1 Not Estab. %   Neutrophils Absolute 3.4 1.4 - 7.0 x10E3/uL   Lymphocytes Absolute 1.6 0.7 - 3.1 x10E3/uL   Monocytes Absolute 0.4 0.1 - 0.9 x10E3/uL    EOS (ABSOLUTE) 0.1 0.0 - 0.4 x10E3/uL   Basophils Absolute 0.0 0.0 - 0.2 x10E3/uL   Immature Granulocytes 0 Not Estab. %   Immature Grans (Abs) 0.0 0.0 - 0.1 x10E3/uL  Comprehensive metabolic panel  Result Value Ref Range   Glucose 90 70 - 99 mg/dL   BUN 9 8 - 27 mg/dL   Creatinine, Ser 9.14 0.57 - 1.00 mg/dL   eGFR 782 >95 AO/ZHY/8.65   BUN/Creatinine Ratio 15 12 - 28   Sodium 145 (H) 134 - 144 mmol/L   Potassium 3.7 3.5 - 5.2 mmol/L   Chloride 110 (H) 96 - 106 mmol/L   CO2 22 20 - 29 mmol/L   Calcium 9.0 8.7 - 10.3 mg/dL   Total Protein 6.0 6.0 - 8.5 g/dL   Albumin 4.1 3.9 - 4.9 g/dL   Globulin, Total 1.9 1.5 - 4.5 g/dL   Albumin/Globulin Ratio 2.2 1.2 - 2.2   Bilirubin Total 0.7 0.0 - 1.2 mg/dL   Alkaline Phosphatase 71 44 - 121 IU/L   AST 18 0 - 40 IU/L   ALT 16 0 - 32 IU/L  Lipid panel  Result Value Ref Range   Cholesterol, Total 140 100 - 199 mg/dL   Triglycerides 784 0 - 149 mg/dL   HDL 49 >69 mg/dL   VLDL Cholesterol Cal 19 5 - 40 mg/dL   LDL Chol Calc (NIH) 72 0 - 99 mg/dL   Chol/HDL Ratio  2.9 0.0 - 4.4 ratio  TSH  Result Value Ref Range   TSH 3.230 0.450 - 4.500 uIU/mL  Urinalysis, Routine w reflex microscopic  Result Value Ref Range   Specific Gravity, UA >1.030 (H) 1.005 - 1.030   pH, UA 5.5 5.0 - 7.5   Color, UA Yellow Yellow   Appearance Ur Clear Clear   Leukocytes,UA Negative Negative   Protein,UA Negative Negative/Trace   Glucose, UA Negative Negative   Ketones, UA Negative Negative   RBC, UA Negative Negative   Bilirubin, UA Negative Negative   Urobilinogen, Ur 0.2 0.2 - 1.0 mg/dL   Nitrite, UA Negative Negative   Microscopic Examination Comment       Assessment & Plan:   Problem List Items Addressed This Visit     BMI 35.0-35.9,adult - Primary    BMI 32.78 kg/m. Weight today is 171 lb. Has interest in starting Wegovy, instructed to verify with insurance to see if this is covered and will send in prescription for this. She has history of  bariatric surgery in 2020 with interest of a weight goal of 130 lbs.       Relevant Orders   HgB A1c   Comp Met (CMET)   Essential hypertension    Chronic, controlled. Checking at home with ranges 130/80. Refills sent for Losartan 100 MG and Amlodipine 5 MG. Recommend to continue checking BP at home and bring readings in, practice DASH diet, and 150 mins of exercise weekly.       Relevant Medications   amLODipine (NORVASC) 5 MG tablet   losartan (COZAAR) 100 MG tablet   Other Relevant Orders   Comp Met (CMET)   Generalized anxiety disorder    Chronic, stable. GAD 7: 3, PHQ 9: 4. Scores have decreased significantly since starting Zoloft, will discontinue zoloft at this time as patient has weaned herself off of the medication for the past two weeks.       Gastroesophageal reflux disease    Chronic, ongoing. Will start Pantoprazole 20 MG daily, continue avoiding triggers and practicing lifestyle techniques to help with this. Return in 1 month if no improvement will increase dose to 40 MG daily.        Relevant Medications   pantoprazole (PROTONIX) 20 MG tablet   Other Relevant Orders   Comp Met (CMET)     Follow up plan: Return in about 1 month (around 07/28/2023) for physical and GERD.

## 2023-06-28 NOTE — Assessment & Plan Note (Addendum)
Chronic, controlled. Checking at home with ranges 130/80. Refills sent for Losartan 100 MG and Amlodipine 5 MG. Recommend to continue checking BP at home and bring readings in, practice DASH diet, and 150 mins of exercise weekly.

## 2023-06-28 NOTE — Assessment & Plan Note (Signed)
Chronic, ongoing. Will start Pantoprazole 20 MG daily, continue avoiding triggers and practicing lifestyle techniques to help with this. Return in 1 month if no improvement will increase dose to 40 MG daily.

## 2023-06-28 NOTE — Assessment & Plan Note (Addendum)
BMI 32.78 kg/m. Weight today is 171 lb. Has interest in starting Wegovy, instructed to verify with insurance to see if this is covered and will send in prescription for this. She has history of bariatric surgery in 2020 with interest of a weight goal of 130 lbs.

## 2023-06-28 NOTE — Assessment & Plan Note (Signed)
Chronic, stable. GAD 7: 3, PHQ 9: 4. Scores have decreased significantly since starting Zoloft, will discontinue zoloft at this time as patient has weaned herself off of the medication for the past two weeks.

## 2023-06-29 LAB — COMPREHENSIVE METABOLIC PANEL WITH GFR
ALT: 15 IU/L (ref 0–32)
AST: 15 IU/L (ref 0–40)
Albumin: 3.8 g/dL — ABNORMAL LOW (ref 3.9–4.9)
Alkaline Phosphatase: 100 IU/L (ref 44–121)
BUN/Creatinine Ratio: 22 (ref 12–28)
BUN: 11 mg/dL (ref 8–27)
Bilirubin Total: 0.4 mg/dL (ref 0.0–1.2)
CO2: 23 mmol/L (ref 20–29)
Calcium: 8.6 mg/dL — ABNORMAL LOW (ref 8.7–10.3)
Chloride: 107 mmol/L — ABNORMAL HIGH (ref 96–106)
Creatinine, Ser: 0.51 mg/dL — ABNORMAL LOW (ref 0.57–1.00)
Globulin, Total: 2.1 g/dL (ref 1.5–4.5)
Glucose: 84 mg/dL (ref 70–99)
Potassium: 4.2 mmol/L (ref 3.5–5.2)
Sodium: 141 mmol/L (ref 134–144)
Total Protein: 5.9 g/dL — ABNORMAL LOW (ref 6.0–8.5)
eGFR: 104 mL/min/1.73

## 2023-06-29 LAB — HEMOGLOBIN A1C
Est. average glucose Bld gHb Est-mCnc: 117 mg/dL
Hgb A1c MFr Bld: 5.7 % — ABNORMAL HIGH (ref 4.8–5.6)

## 2023-07-05 ENCOUNTER — Telehealth: Payer: Self-pay

## 2023-07-05 NOTE — Telephone Encounter (Signed)
-----   Message from Weber Cooks sent at 07/05/2023  8:37 AM EST ----- Hi are you able to reach out to [facility] and let her know her A1C returned at 5.7 % putting her in the pre diabetes range, I recommend lifestyle changes such as diet and exercise to maintain these numbers. Her electrolytes returned stable and normal, calcium levels were on the low side of normal, recommend consuming foods that are high in calcium. Also can you encourage her to activate MyChart for further communication. Thank you!

## 2023-07-05 NOTE — Telephone Encounter (Signed)
Patient would like to know if she is able to be put on wegovy for weight management. Would like to have you give her a call tomorrow 07/06/23

## 2023-07-05 NOTE — Progress Notes (Signed)
Hi are you able to reach out to [facility] and let her know her A1C returned at 5.7 % putting her in the pre diabetes range, I recommend lifestyle changes such as diet and exercise to maintain these numbers. Her electrolytes returned stable and normal, calcium levels were on the low side of normal, recommend consuming foods that are high in calcium. Also can you encourage her to activate MyChart for further communication. Thank you!

## 2023-07-06 ENCOUNTER — Telehealth: Payer: Self-pay | Admitting: Nurse Practitioner

## 2023-07-06 NOTE — Telephone Encounter (Signed)
Copied from CRM [id number]. Topic: General - Other >> Jul 06, 2023  4:27 PM Macon Large wrote: Reason for CRM: Pt called for an update on the request for [facility]. Cb#  [phone]

## 2023-07-06 NOTE — Telephone Encounter (Signed)
Returned call to patient however had to leave a message. Provider not back into the office until 07/11/2023 and will review request at that time. Patient should contact her insurance also and verify that the medication is an option for her as a non diabetic as many insurances do not allow.   Ok for [facility] to review if patient returns calls.

## 2023-08-08 ENCOUNTER — Encounter: Payer: Self-pay | Admitting: Nurse Practitioner

## 2023-08-08 ENCOUNTER — Ambulatory Visit (INDEPENDENT_AMBULATORY_CARE_PROVIDER_SITE_OTHER): Payer: PRIVATE HEALTH INSURANCE | Admitting: Nurse Practitioner

## 2023-08-08 VITALS — BP 130/84 | HR 62 | Temp 98.1°F | Resp 15 | Ht 59.0 in | Wt 166.2 lb

## 2023-08-08 DIAGNOSIS — Z23 Encounter for immunization: Secondary | ICD-10-CM

## 2023-08-08 DIAGNOSIS — F331 Major depressive disorder, recurrent, moderate: Secondary | ICD-10-CM

## 2023-08-08 DIAGNOSIS — I10 Essential (primary) hypertension: Secondary | ICD-10-CM | POA: Diagnosis not present

## 2023-08-08 DIAGNOSIS — F411 Generalized anxiety disorder: Secondary | ICD-10-CM | POA: Diagnosis not present

## 2023-08-08 DIAGNOSIS — R141 Gas pain: Secondary | ICD-10-CM

## 2023-08-08 DIAGNOSIS — R7303 Prediabetes: Secondary | ICD-10-CM

## 2023-08-08 DIAGNOSIS — Z0000 Encounter for general adult medical examination without abnormal findings: Secondary | ICD-10-CM | POA: Diagnosis not present

## 2023-08-08 DIAGNOSIS — Z1211 Encounter for screening for malignant neoplasm of colon: Secondary | ICD-10-CM

## 2023-08-08 DIAGNOSIS — Z136 Encounter for screening for cardiovascular disorders: Secondary | ICD-10-CM

## 2023-08-08 DIAGNOSIS — Z1231 Encounter for screening mammogram for malignant neoplasm of breast: Secondary | ICD-10-CM

## 2023-08-08 LAB — URINALYSIS, ROUTINE W REFLEX MICROSCOPIC
Bilirubin, UA: NEGATIVE
Glucose, UA: NEGATIVE
Ketones, UA: NEGATIVE
Leukocytes,UA: NEGATIVE
Nitrite, UA: NEGATIVE
Protein,UA: NEGATIVE
RBC, UA: NEGATIVE
Specific Gravity, UA: 1.02 (ref 1.005–1.030)
Urobilinogen, Ur: 0.2 mg/dL (ref 0.2–1.0)
pH, UA: 5.5 (ref 5.0–7.5)

## 2023-08-08 MED ORDER — AMLODIPINE BESYLATE 5 MG PO TABS: 5.0000 mg | ORAL_TABLET | Freq: Every day | ORAL | 1 refills | Status: DC

## 2023-08-08 MED ORDER — PANTOPRAZOLE SODIUM 20 MG PO TBEC: 20.0000 mg | DELAYED_RELEASE_TABLET | Freq: Every day | ORAL | 1 refills | Status: DC

## 2023-08-08 MED ORDER — LOSARTAN POTASSIUM 100 MG PO TABS: ORAL_TABLET | ORAL | 1 refills | Status: DC

## 2023-08-08 NOTE — Assessment & Plan Note (Signed)
Chronic.  Controlled.  Continue with current medication regimen.  Last A1c was 5.7%.  Labs ordered today.  Return to clinic in 6 months for reevaluation.  Call sooner if concerns arise.

## 2023-08-08 NOTE — Progress Notes (Signed)
BP 130/84 (BP Location: Right Arm, Patient Position: Sitting, Cuff Size: Normal)   Pulse 62   Temp 98.1 F (36.7 C) (Oral)   Resp 15   Ht 4\' 11"  (1.499 m)   Wt 166 lb 3.2 oz (75.4 kg)   LMP  (LMP Unknown)   SpO2 97%   BMI 33.57 kg/m    Subjective:    Patient ID: Sandra [name], female    DOB: 1961-06-02, 62 y.o.   MRN: [id number]  HPI: Sandra [name] is a 62 y.o. female presenting on 08/08/2023 for comprehensive medical examination. Current medical complaints include: Problem hearing  She currently lives with: Menopausal Symptoms: no  HYPERTENSION without Chronic Kidney Disease Hypertension status: controlled  Satisfied with current treatment? yes Duration of hypertension: years BP monitoring frequency:  daily BP range: 130/85 BP medication side effects:  no Medication compliance: excellent compliance Previous BP meds:amlodipine and losartan (cozaar) Aspirin: no Recurrent headaches: no Visual changes: no Palpitations: no Dyspnea: no Chest pain: no Lower extremity edema: no Dizzy/lightheaded: no  GERD GERD control status: controlled- with pantoprazole Satisfied with current treatment? no Heartburn frequency: daily  Medication side effects: NA   Medication compliance: NA Previous GERD medications: she has not taken anything for prevention.  Reports current symptoms started about 2 months ago  Antacid use frequency:  daily, using TUMS plus gas  Duration: several hours  Nature: reports feeling like there is a thick mucus or foam she has to cough up  Alleviatiating factors: nothing so far has helped  Aggravating factors: greasy foods seem to aggravate the most  Dysphagia: no Odynophagia:  no Hematemesis: no Blood in stool: no EGD: unsure if she has had one     DEPRESSION Feels like she is doing better.  She is less stressed and focusing more on positive things.   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  Significant weight loss or gain: no Insomnia: no  Fatigue: no Feelings of worthlessness or guilt: no Impaired concentration/indecisiveness: no Suicidal ideations: no Hopelessness: no Crying spells: no  Depression Screen done today and results listed below:     08/08/2023   10:54 AM 06/28/2023    2:31 PM 03/14/2023    4:51 PM 08/03/2022    9:45 AM 05/04/2022    9:45 AM  Depression screen PHQ 2/9  Decreased Interest 0 1 2 0 2  Down, Depressed, Hopeless 0 0 2 1 2   PHQ - 2 Score 0 1 4 1 4   Altered sleeping 0 0 1 0 1  Tired, decreased energy 0 0 3 2 1   Change in appetite 3 3 3 3 3   Feeling bad or failure about yourself  0 0 3 1 1   Trouble concentrating 1 0 3 1 1   Moving slowly or fidgety/restless 0 0 0 1 0  Suicidal thoughts 0 0 0 0 0  PHQ-9 Score 4 4 17 9 11   Difficult doing work/chores Not difficult at all Not difficult at all Somewhat difficult Somewhat difficult Not difficult at all    The patient does  not have a history of falls. I did complete a risk assessment for falls. A plan of care for falls was documented.   Past Medical History:  Past Medical History:  Diagnosis Date   Anxiety    Arthritis    Depression    Gout    Hypertension    Obesity     Surgical History:  Past Surgical History:  Procedure Laterality Date   APPENDECTOMY     CESAREAN SECTION     x 2   CHOLECYSTECTOMY  1987    Medications:  Current Outpatient Medications on File Prior to Visit  Medication Sig   colchicine 0.6 MG tablet Take 1 tablet (0.6 mg total) by mouth daily as  needed.   No current facility-administered medications on file prior to visit.    Allergies:  Allergies  Allergen Reactions   Clarithromycin Anaphylaxis and Swelling    Pt took one tablet of biaxin at least 10 years ago and her Tongue immediately swelled. Pt took one tablet of biaxin at least 10 years ago and her Tongue immediately swelled.    Allopurinol Hives and Other (See Comments)   Lisinopril Hives   Prednisone Hives and Other (See Comments)   Other Other (See Comments)    Peanut butter - trouble swallowing   Penicillins Rash    Social History:  Social History   Socioeconomic History   Marital status: Widowed    Spouse name: Not on file   Number of children: Not on file   Years of education: Not on file   Highest education level: Not on file  Occupational History   Not on file  Tobacco Use   Smoking status: Never   Smokeless tobacco: Never  Vaping Use   Vaping status: Never Used  Substance and Sexual Activity   Alcohol use: No   Drug use: No   Sexual activity: Not Currently  Other Topics Concern   Not on file  Social History Narrative   Not on file   Social Drivers of Health   Financial Resource Strain: High Risk (08/08/2023)   Overall Financial Resource Strain (CARDIA)    Difficulty of Paying Living Expenses: Hard  Food Insecurity: Food Insecurity Present (08/08/2023)   Hunger Vital Sign    Worried About Running Out of Food in the Last Year: Sometimes true    Ran Out of Food in the Last Year: Sometimes true  Transportation Needs: No Transportation Needs (08/08/2023)   PRAPARE - Administrator, Civil Service (Medical): No    Lack of Transportation (Non-Medical): No  Physical Activity: Insufficiently Active (08/08/2023)   Exercise Vital Sign    Days of Exercise per Week: 2 days    Minutes of Exercise per Session: 60 min  Stress: No Stress Concern Present (08/08/2023)   [employer] of Occupational Health - Occupational Stress  Questionnaire    Feeling of Stress : Not at all  Social Connections: Moderately Integrated (08/08/2023)   Social Connection and Isolation Panel [NHANES]    Frequency of Communication with Friends and Family: Three times a week    Frequency of Social Gatherings with Friends and Family: Once a week    Attends Religious Services: More than 4 times per year    Active Member of [employer] or Organizations: Yes    Attends Banker Meetings: 1 to 4 times per year    Marital Status: Widowed  Intimate Partner Violence: Not At Risk (08/08/2023)   Humiliation, Afraid, Rape, and Kick  questionnaire    Fear of Current or Ex-Partner: No    Emotionally Abused: No    Physically Abused: No    Sexually Abused: No   Social History   Tobacco Use  Smoking Status Never  Smokeless Tobacco Never   Social History   Substance and Sexual Activity  Alcohol Use No    Family History:  Family History  Problem Relation Age of Onset   Breast cancer Mother 28   Heart disease Mother    Heart attack Mother    Hypertension Mother    Cancer Mother        breast   Stroke Mother    Heart disease Father    Heart attack Father     Past medical history, surgical history, medications, allergies, family history and social history reviewed with patient today and changes made to appropriate areas of the chart.   Review of Systems  Eyes:  Negative for blurred vision and double vision.  Respiratory:  Negative for shortness of breath.   Cardiovascular:  Negative for chest pain, palpitations and leg swelling.  Neurological:  Negative for dizziness and headaches.   All other ROS negative except what is listed above and in the HPI.      Objective:    BP 130/84 (BP Location: Right Arm, Patient Position: Sitting, Cuff Size: Normal)   Pulse 62   Temp 98.1 F (36.7 C) (Oral)   Resp 15   Ht 4\' 11"  (1.499 m)   Wt 166 lb 3.2 oz (75.4 kg)   LMP  (LMP Unknown)   SpO2 97%   BMI 33.57 kg/m   Wt Readings  from Last 3 Encounters:  08/08/23 166 lb 3.2 oz (75.4 kg)  06/28/23 171 lb 6.4 oz (77.7 kg)  03/14/23 168 lb 6.4 oz (76.4 kg)    Physical Exam Vitals and nursing note reviewed.  Constitutional:      General: She is awake. She is not in acute distress.    Appearance: Normal appearance. She is well-developed. She is obese. She is not ill-appearing.  HENT:     Head: Normocephalic and atraumatic.     Right Ear: Hearing, tympanic membrane, ear canal and external ear normal. No drainage.     Left Ear: Hearing, tympanic membrane, ear canal and external ear normal. No drainage.     Nose: Nose normal.     Right Sinus: No maxillary sinus tenderness or frontal sinus tenderness.     Left Sinus: No maxillary sinus tenderness or frontal sinus tenderness.     Mouth/Throat:     Mouth: Mucous membranes are moist.     Pharynx: Oropharynx is clear. Uvula midline. No pharyngeal swelling, oropharyngeal exudate or posterior oropharyngeal erythema.  Eyes:     General: Lids are normal.        Right eye: No discharge.        Left eye: No discharge.     Extraocular Movements: Extraocular movements intact.     Conjunctiva/sclera: Conjunctivae normal.     Pupils: Pupils are equal, round, and reactive to light.     Visual Fields: Right eye visual fields normal and left eye visual fields normal.  Neck:     Thyroid: No thyromegaly.     Vascular: No carotid bruit.     Trachea: Trachea normal.  Cardiovascular:     Rate and Rhythm: Normal rate and regular rhythm.     Heart sounds: Normal heart sounds. No murmur heard.    No gallop.  Pulmonary:     Effort: Pulmonary effort is normal. No accessory muscle usage or respiratory distress.     Breath sounds: Normal breath sounds.  Chest:  Breasts:    Right: Normal.     Left: Normal.  Abdominal:     General: Bowel sounds are normal.     Palpations: Abdomen is soft. There is no hepatomegaly or splenomegaly.     Tenderness: There is no abdominal tenderness.   Musculoskeletal:        General: Normal range of motion.     Cervical back: Normal range of motion and neck supple.     Right lower leg: No edema.     Left lower leg: No edema.  Lymphadenopathy:     Head:     Right side of head: No submental, submandibular, tonsillar, preauricular or posterior auricular adenopathy.     Left side of head: No submental, submandibular, tonsillar, preauricular or posterior auricular adenopathy.     Cervical: No cervical adenopathy.     Upper Body:     Right upper body: No supraclavicular, axillary or pectoral adenopathy.     Left upper body: No supraclavicular, axillary or pectoral adenopathy.  Skin:    General: Skin is warm and dry.     Capillary Refill: Capillary refill takes less than 2 seconds.     Findings: No rash.  Neurological:     Mental Status: She is alert and oriented to person, place, and time.     Gait: Gait is intact.  Psychiatric:        Attention and Perception: Attention normal.        Mood and Affect: Mood normal.        Speech: Speech normal.        Behavior: Behavior normal. Behavior is cooperative.        Thought Content: Thought content normal.        Judgment: Judgment normal.     Results for orders placed or performed in visit on 06/28/23  HgB A1c   Collection Time: 06/28/23  3:18 PM  Result Value Ref Range   Hgb A1c MFr Bld 5.7 (H) 4.8 - 5.6 %   Est. average glucose Bld gHb Est-mCnc 117 mg/dL  Comp Met (CMET)   Collection Time: 06/28/23  3:18 PM  Result Value Ref Range   Glucose 84 70 - 99 mg/dL   BUN 11 8 - 27 mg/dL   Creatinine, Ser 1.61 (L) 0.57 - 1.00 mg/dL   eGFR 096 >04 VW/UJW/1.19   BUN/Creatinine Ratio 22 12 - 28   Sodium 141 134 - 144 mmol/L   Potassium 4.2 3.5 - 5.2 mmol/L   Chloride 107 (H) 96 - 106 mmol/L   CO2 23 20 - 29 mmol/L   Calcium 8.6 (L) 8.7 - 10.3 mg/dL   Total Protein 5.9 (L) 6.0 - 8.5 g/dL   Albumin 3.8 (L) 3.9 - 4.9 g/dL   Globulin, Total 2.1 1.5 - 4.5 g/dL   Bilirubin Total 0.4 0.0  - 1.2 mg/dL   Alkaline Phosphatase 100 44 - 121 IU/L   AST 15 0 - 40 IU/L   ALT 15 0 - 32 IU/L      Assessment & Plan:   Problem List Items Addressed This Visit       Cardiovascular and Mediastinum   Essential hypertension   Chronic.  Controlled.  Continue with current medication regimen of Amlodipine 5mg  and Losartan 100mg .  Refills sent today.  Labs ordered today.  Return to clinic in 6 months for reevaluation.  Call sooner if concerns arise.        Relevant Medications   amLODipine (NORVASC) 5 MG tablet   losartan (COZAAR) 100 MG tablet   Other Relevant Orders   Comprehensive metabolic panel     Other   Generalized anxiety disorder   Chronic.  Controlled without medication.  Labs ordered today.  Return to clinic in 6 months for reevaluation.  Call sooner if concerns arise.       Moderate episode of recurrent major depressive disorder (HCC)   Chronic.  Controlled without medication.  Labs ordered today.  Return to clinic in 6 months for reevaluation.  Call sooner if concerns arise.        Abdominal gas pain   Chronic.  Controlled.  Continue with current medication regimen of Pantoprazole.  Refills sent today.  Labs ordered today.  Return to clinic in 6 months for reevaluation.  Call sooner if concerns arise.        Prediabetes   Chronic.  Controlled.  Continue with current medication regimen.  Last A1c was 5.7%.  Labs ordered today.  Return to clinic in 6 months for reevaluation.  Call sooner if concerns arise.        Other Visit Diagnoses       Annual physical exam    -  Primary   Health maintenance reviewed during visit today.  Labs ordered.  Vaccines reviewed.  Colonoscopy ordered. Will get PAP at next visit. Mammogram ordered.   Relevant Orders   CBC with Differential/Platelet   Comprehensive metabolic panel   Lipid panel   TSH   Urinalysis, Routine w reflex microscopic     Screening for ischemic heart disease       Relevant Orders   Lipid panel     Need  for influenza vaccination       Relevant Orders   Flu vaccine trivalent PF, 6mos and older(Flulaval,Afluria,Fluarix,Fluzone) (Completed)     Screening for colon cancer       Relevant Orders   Ambulatory referral to Gastroenterology     Need for tetanus booster       Relevant Orders   Tdap vaccine greater than or equal to 7yo IM (Completed)     Encounter for screening mammogram for malignant neoplasm of breast       Relevant Orders   MM 3D SCREENING MAMMOGRAM BILATERAL BREAST        Follow up plan: Return in about 6 months (around 02/06/2024) for HTN, HLD, DM2 FU.   LABORATORY TESTING:  - Pap smear:  Declined  IMMUNIZATIONS:   - Tdap: Tetanus vaccination status reviewed: Given today. - Influenza: Administered today - Pneumovax: Not applicable - Prevnar: Not applicable - COVID: Not applicable - HPV: Not applicable - Shingrix vaccine:  Will get at next visit   SCREENING: -Mammogram: Ordered today  - Colonoscopy: Ordered today  - Bone Density: Not applicable  -Hearing Test: Not applicable  -Spirometry: Not applicable   PATIENT COUNSELING:   Advised to take 1 mg of folate supplement per day if capable of pregnancy.   Sexuality: Discussed sexually transmitted diseases, partner selection, use of condoms, avoidance of unintended pregnancy  and contraceptive alternatives.   Advised to avoid cigarette smoking.  I discussed with the patient that most people either abstain from alcohol or drink within safe limits (<=14/week and <=4 drinks/occasion for males, <=7/weeks and <= 3 drinks/occasion for females) and that the risk for alcohol  disorders and other health effects rises proportionally with the number of drinks per week and how often a drinker exceeds daily limits.  Discussed cessation/primary prevention of drug use and availability of treatment for abuse.   Diet: Encouraged to adjust caloric intake to maintain  or achieve ideal body weight, to reduce intake of dietary  saturated fat and total fat, to limit sodium intake by avoiding high sodium foods and not adding table salt, and to maintain adequate dietary potassium and calcium preferably from fresh fruits, vegetables, and low-fat dairy products.    stressed the importance of regular exercise  Injury prevention: Discussed safety belts, safety helmets, smoke detector, smoking near bedding or upholstery.   Dental health: Discussed importance of regular tooth brushing, flossing, and dental visits.    NEXT PREVENTATIVE PHYSICAL DUE IN 1 YEAR. Return in about 6 months (around 02/06/2024) for HTN, HLD, DM2 FU.

## 2023-08-08 NOTE — Assessment & Plan Note (Signed)
Chronic.  Controlled without medication..  Labs ordered today.  Return to clinic in 6 months for reevaluation.  Call sooner if concerns arise.  ° °

## 2023-08-08 NOTE — Patient Instructions (Signed)
Please call to schedule your mammogram and/or bone density: [facility]  Address: [address], [place], [place] [address] Phone: [phone]  [facility] [place] [address] [place],  [place]  [address] Phone: [phone]   

## 2023-08-08 NOTE — Assessment & Plan Note (Signed)
Chronic.  Controlled.  Continue with current medication regimen of Pantoprazole.  Refills sent today.  Labs ordered today.  Return to clinic in 6 months for reevaluation.  Call sooner if concerns arise.

## 2023-08-08 NOTE — Assessment & Plan Note (Signed)
Chronic.  Controlled.  Continue with current medication regimen of Amlodipine 5mg  and Losartan 100mg .  Refills sent today.  Labs ordered today.  Return to clinic in 6 months for reevaluation.  Call sooner if concerns arise.

## 2023-08-09 LAB — COMPREHENSIVE METABOLIC PANEL
ALT: 13 [IU]/L (ref 0–32)
AST: 12 [IU]/L (ref 0–40)
Albumin: 4.3 g/dL (ref 3.9–4.9)
Alkaline Phosphatase: 103 [IU]/L (ref 44–121)
BUN/Creatinine Ratio: 16 (ref 12–28)
BUN: 9 mg/dL (ref 8–27)
Bilirubin Total: 0.7 mg/dL (ref 0.0–1.2)
CO2: 21 mmol/L (ref 20–29)
Calcium: 8.8 mg/dL (ref 8.7–10.3)
Chloride: 105 mmol/L (ref 96–106)
Creatinine, Ser: 0.58 mg/dL (ref 0.57–1.00)
Globulin, Total: 2.1 g/dL (ref 1.5–4.5)
Glucose: 87 mg/dL (ref 70–99)
Potassium: 4 mmol/L (ref 3.5–5.2)
Sodium: 140 mmol/L (ref 134–144)
Total Protein: 6.4 g/dL (ref 6.0–8.5)
eGFR: 101 mL/min/{1.73_m2} (ref >59)

## 2023-08-09 LAB — CBC WITH DIFFERENTIAL/PLATELET
Basophils Absolute: 0.1 10*3/uL (ref 0.0–0.2)
Basos: 1 %
EOS (ABSOLUTE): 0.1 10*3/uL (ref 0.0–0.4)
Eos: 1 %
Hematocrit: 39.8 % (ref 34.0–46.6)
Hemoglobin: 13.6 g/dL (ref 11.1–15.9)
Immature Grans (Abs): 0 10*3/uL (ref 0.0–0.1)
Immature Granulocytes: 0 %
Lymphocytes Absolute: 1.9 10*3/uL (ref 0.7–3.1)
Lymphs: 26 %
MCH: 30.2 pg (ref 26.6–33.0)
MCHC: 34.2 g/dL (ref 31.5–35.7)
MCV: 88 fL (ref 79–97)
Monocytes Absolute: 0.4 10*3/uL (ref 0.1–0.9)
Monocytes: 6 %
Neutrophils Absolute: 4.8 10*3/uL (ref 1.4–7.0)
Neutrophils: 66 %
Platelets: 250 10*3/uL (ref 150–450)
RBC: 4.5 x10E6/uL (ref 3.77–5.28)
RDW: 13.6 % (ref 11.7–15.4)
WBC: 7.3 10*3/uL (ref 3.4–10.8)

## 2023-08-09 LAB — LIPID PANEL
Chol/HDL Ratio: 3.2 {ratio} (ref 0.0–4.4)
Cholesterol, Total: 146 mg/dL (ref 100–199)
HDL: 45 mg/dL (ref >39)
LDL Chol Calc (NIH): 81 mg/dL (ref 0–99)
Triglycerides: 110 mg/dL (ref 0–149)
VLDL Cholesterol Cal: 20 mg/dL (ref 5–40)

## 2023-08-09 LAB — TSH: TSH: 3.35 u[IU]/mL (ref 0.450–4.500)

## 2023-08-11 ENCOUNTER — Telehealth: Payer: Self-pay | Admitting: Nurse Practitioner

## 2023-08-11 ENCOUNTER — Encounter: Payer: Self-pay | Admitting: Nurse Practitioner

## 2023-08-11 NOTE — Telephone Encounter (Signed)
 Letter written for the patient. Placed up front for pick up. Called and notified patient that letter was ready to be picked up.

## 2023-08-11 NOTE — Telephone Encounter (Signed)
 Copied from CRM [id number]. Topic: General - Inquiry >> Aug 11, 2023 11:43 AM Edsel HERO wrote: Patient is requesting a return to work note for tomorrow. Patient states that she received 2 vaccines on Tuesdays and has been sick and out of work since. Please advise.

## 2023-08-11 NOTE — Telephone Encounter (Signed)
Okay to write note for patient?

## 2023-08-24 ENCOUNTER — Telehealth: Payer: Self-pay | Admitting: Nurse Practitioner

## 2023-08-24 NOTE — Telephone Encounter (Signed)
 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 Advised patient that we would be glad to help her with any issues or concerns she may have. Asked for patient to please return my call if there was something we could help her with.

## 2023-08-24 NOTE — Telephone Encounter (Signed)
 Copied from CRM [id number]. Topic: General - Other >> Aug 23, 2023  4:25 PM May Sparks wrote: Attn: Nurse Pearley Can you please call, regarding our discussion we had at my visit on June 28, 2923. Please advise.

## 2023-08-29 ENCOUNTER — Encounter: Payer: Self-pay | Admitting: *Deleted

## 2023-08-30 ENCOUNTER — Other Ambulatory Visit: Payer: Self-pay | Admitting: Physician Assistant

## 2023-08-30 DIAGNOSIS — I10 Essential (primary) hypertension: Secondary | ICD-10-CM

## 2023-08-30 NOTE — Telephone Encounter (Signed)
Change in pharmacy location- should be able to view within store- but will forward Requested Prescriptions  Pending Prescriptions Disp Refills   amLODipine (NORVASC) 5 MG tablet [Pharmacy Med Name: amLODIPine Besylate 5 MG Oral Tablet] 90 tablet 0    Sig: Take 1 tablet by mouth once daily     Cardiovascular: Calcium Channel Blockers 2 Passed - 08/30/2023  4:19 PM      Passed - Last BP in normal range    BP Readings from Last 1 Encounters:  08/08/23 130/84         Passed - Last Heart Rate in normal range    Pulse Readings from Last 1 Encounters:  08/08/23 62         Passed - Valid encounter within last 6 months    Recent Outpatient Visits           3 weeks ago Annual physical exam   [place] [facility] Larae Grooms, NP   2 months ago BMI 35.0-35.9,adult   [place] [facility], Sherran Needs, NP   5 months ago Moderate episode of recurrent major depressive disorder (HCC)   [place] [facility] Mecum, Oswaldo Conroy, PA-C   1 year ago Annual physical exam   [place] [facility] Larae Grooms, NP   1 year ago Moderate episode of recurrent major depressive disorder [facility]   [place] Larae Grooms, NP       Future Appointments             In 5 months Larae Grooms, NP [facility]

## 2023-09-08 ENCOUNTER — Telehealth: Payer: Self-pay | Admitting: Nurse Practitioner

## 2023-09-08 NOTE — Telephone Encounter (Signed)
Neither of the medications mentioned have been written for the patient. Patient will need to come in to discuss the medications with her provider.

## 2023-09-08 NOTE — Telephone Encounter (Signed)
Copied from CRM [id number]. Topic: General - Other >> Sep 08, 2023 11:58 AM Macon Large wrote: Reason for CRM: Pt stated that her pharmacy advised her to contact her pcp for an approval for either [facility] or [facility]. Pt requests call back to advise. Cb# [phone]

## 2023-09-08 NOTE — Telephone Encounter (Signed)
Called patient she stated that she will call back and schedule her appt.

## 2023-11-10 ENCOUNTER — Telehealth: Payer: Self-pay

## 2023-11-10 DIAGNOSIS — Z23 Encounter for immunization: Secondary | ICD-10-CM

## 2023-11-10 NOTE — Telephone Encounter (Signed)
Order placed for lab visit

## 2023-11-10 NOTE — Telephone Encounter (Signed)
 Copied from CRM [id number]. Topic: Clinical - Request for Lab/Test Order >> Nov 10, 2023 12:47 PM Fredrica W wrote: Reason for CRM: Patient called she needs TB test completed for job and would like to see if she has a valid one on file and if not, would like to have it completed. She says for job can be blood test or skin test and that she is not sure how recent the results need to be for blood test. Thank You

## 2023-11-10 NOTE — Telephone Encounter (Signed)
 Called and LVM notifying patient that her lab order has been placed and that she can call us back to schedule a lab visit.

## 2023-11-10 NOTE — Telephone Encounter (Signed)
 Routing to provider. Can a TB order be placed for the patient?

## 2023-11-11 ENCOUNTER — Other Ambulatory Visit: Payer: Self-pay

## 2023-12-09 ENCOUNTER — Other Ambulatory Visit: Payer: Self-pay | Admitting: Nurse Practitioner

## 2023-12-09 DIAGNOSIS — I10 Essential (primary) hypertension: Secondary | ICD-10-CM

## 2023-12-09 NOTE — Telephone Encounter (Signed)
 Copied from CRM [id number]. Topic: Clinical - Medication Refill >> Dec 09, 2023 10:20 AM Leory Rands wrote: Most Recent Primary Care Visit:  Provider: Aileen Alexanders  Department: [id number]  Visit Type: PHYSICAL  Date: 08/08/2023  Medication: losartan  (COZAAR ) 100 MG tablet [[id number]]  Has the patient contacted their pharmacy? Yes (Agent: If no, request that the patient contact the pharmacy for the refill. If patient does not wish to contact the pharmacy document the reason why and proceed with request.) (Agent: If yes, when and what did the pharmacy advise?)  Is this the correct pharmacy for this prescription? Yes If no, delete pharmacy and type the correct one.  This is the patient's preferred pharmacy:  [facility] [address] [place] - [address] [place] [address] Phone: [phone] Fax: [phone]   Has the prescription been filled recently? Yes  Is the patient out of the medication? Yes  Has the patient been seen for an appointment in the last year OR does the patient have an upcoming appointment? Yes  Can we respond through MyChart? Yes  Agent: Please be advised that Rx refills may take up to 3 business days. We ask that you follow-up with your pharmacy.

## 2023-12-12 MED ORDER — LOSARTAN POTASSIUM 100 MG PO TABS
ORAL_TABLET | ORAL | 0 refills | Status: DC
Start: 2023-12-12 — End: 2024-03-14

## 2023-12-12 NOTE — Telephone Encounter (Signed)
 Requested Prescriptions  Pending Prescriptions Disp Refills   losartan  (COZAAR ) 100 MG tablet 90 tablet 0    Sig: Take 1 tablet by mouth in the evening     Cardiovascular:  Angiotensin Receptor Blockers Failed - 12/12/2023  4:11 PM      Failed - Valid encounter within last 6 months    Recent Outpatient Visits   None     Future Appointments             In 2 months Sandra Alexanders, NP [facility] [facility]            Passed - Cr in normal range and within 180 days    Creatinine  Date Value Ref Range Status  09/11/2014 0.74 0.60 - 1.30 mg/dL Final   Creatinine, Ser  Date Value Ref Range Status  08/08/2023 0.58 0.57 - 1.00 mg/dL Final         Passed - K in normal range and within 180 days    Potassium  Date Value Ref Range Status  08/08/2023 4.0 3.5 - 5.2 mmol/L Final  09/11/2014 4.4 3.5 - 5.1 mmol/L Final         Passed - Patient is not pregnant      Passed - Last BP in normal range    BP Readings from Last 1 Encounters:  08/08/23 130/84

## 2023-12-28 ENCOUNTER — Ambulatory Visit: Payer: Self-pay

## 2023-12-28 NOTE — Telephone Encounter (Signed)
  Chief Complaint: cough, back pain Symptoms: pain, productive cough Frequency: today Pertinent Negatives: Patient denies fever, SOB, difficulty breathing Disposition: [] ED /[x] Urgent Care (no appt availability in office) / [] Appointment(In office/virtual)/ []  [facility]/ [] Home Care/ [] Refused Recommended Disposition /[] [facility]/ []  Follow-up with PCP Additional Notes: Pt states that she needs to be seen today before she goes to work. At time of NT call soonest appt was 5/23. Pt states that she would like to be seen today so that she can have proper documentation for illness for work.  Pt advised to utilize UC, pt agreeable.      Summary: Symptoms, no appt soon enough   Copied From CRM [id number]. Reason for Triage: Possible infection, sides hurt, overall dont feel well. Ears hurt, bad cough, headache.  Declined appt offer, wants to be seen today.  Best contact: [phone]         Reason for Disposition . SEVERE coughing spells (e.g., whooping sound after coughing, vomiting after coughing)  Answer Assessment - Initial Assessment Questions 1. ONSET: "When did the cough begin?"      today 2. SEVERITY: "How bad is the cough today?"      Severe, coughing alot 3. SPUTUM: "Describe the color of your sputum" (none, dry cough; clear, white, yellow, green)     green 4. HEMOPTYSIS: "Are you coughing up any blood?" If so ask: "How much?" (flecks, streaks, tablespoons, etc.)     denies 5. DIFFICULTY BREATHING: "Are you having difficulty breathing?" If Yes, ask: "How bad is it?" (e.g., mild, moderate, severe)    - MILD: No SOB at rest, mild SOB with walking, speaks normally in sentences, can lie down, no retractions, pulse < 100.    - MODERATE: SOB at rest, SOB with minimal exertion and prefers to sit, cannot lie down flat, speaks in phrases, mild retractions, audible wheezing, pulse 100-120.    - SEVERE: Very SOB at rest, speaks in single words, struggling to  breathe, sitting hunched forward, retractions, pulse > 120      denies 6. FEVER: "Do you have a fever?" If Yes, ask: "What is your temperature, how was it measured, and when did it start?"     denies 7. CARDIAC HISTORY: "Do you have any history of heart disease?" (e.g., heart attack, congestive heart failure)      denies 8. LUNG HISTORY: "Do you have any history of lung disease?"  (e.g., pulmonary embolus, asthma, emphysema)     denies 9. PE RISK FACTORS: "Do you have a history of blood clots?" (or: recent major surgery, recent prolonged travel, bedridden)     denies 10. OTHER SYMPTOMS: "Do you have any other symptoms?" (e.g., runny nose, wheezing, chest pain)       Sore throat, HA,  12. TRAVEL: "Have you traveled out of the country in the last month?" (e.g., travel history, exposures)       denies  Protocols used: Cough - Acute Productive-A-AH

## 2024-01-07 DIAGNOSIS — J019 Acute sinusitis, unspecified: Secondary | ICD-10-CM | POA: Diagnosis not present

## 2024-02-13 ENCOUNTER — Telehealth: Payer: Self-pay

## 2024-02-13 ENCOUNTER — Ambulatory Visit: Payer: Self-pay | Admitting: Nurse Practitioner

## 2024-02-13 NOTE — Telephone Encounter (Signed)
 Routing to provider. Can referral be entered or would you like to see the patient first?

## 2024-02-13 NOTE — Telephone Encounter (Signed)
 Please call and schedule the patient an appointment per Darice. See message below.

## 2024-02-13 NOTE — Progress Notes (Deleted)
 LMP  (LMP Unknown)    Subjective:    Patient ID: Sandra [name], female    DOB: Jan 02, 1961, 63 y.o.   MRN: [id number]  HPI: Sandra [name] is a 63 y.o. female  No chief complaint on file.  HYPERTENSION without Chronic Kidney Disease Hypertension status: controlled  Satisfied with current treatment? yes Duration of hypertension: years BP monitoring frequency:  daily BP range: 130/85 BP medication side effects:  no Medication compliance: excellent compliance Previous BP meds:amlodipine  and losartan  (cozaar ) Aspirin : no Recurrent headaches: no Visual changes: no Palpitations: no Dyspnea: no Chest pain: no Lower extremity edema: no Dizzy/lightheaded: no   GERD GERD control status: controlled- with pantoprazole  Satisfied with current treatment? no Heartburn frequency: daily  Medication side effects: NA   Medication compliance: NA Previous GERD medications: she has not taken anything for prevention.  Reports current symptoms started about 2 months ago  Antacid use frequency:  daily, using TUMS plus gas  Duration: several hours  Nature: reports feeling like there is a thick mucus or foam she has to cough up  Alleviatiating factors: nothing so far has helped  Aggravating factors: greasy foods seem to aggravate the most  Dysphagia: no Odynophagia:  no Hematemesis: no Blood in stool: no EGD: unsure if she has had one      DEPRESSION Feels like she is doing better.  She is less stressed and focusing more on positive things.   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 Interim medical history since our last visit reviewed. Allergies and medications reviewed and updated.  Review of Systems  Per HPI unless specifically indicated above     Objective:    LMP  (LMP Unknown)   Wt Readings from Last 3 Encounters:  08/08/23 166 lb 3.2 oz (75.4 kg)  06/28/23 171 lb 6.4 oz (77.7 kg)  03/14/23 168 lb 6.4 oz (76.4 kg)    Physical Exam  Results for orders placed or performed in visit on 08/08/23  Urinalysis, Routine w reflex microscopic   Collection Time: 08/08/23 11:30 AM  Result Value Ref Range   Specific Gravity, UA 1.020 1.005 - 1.030   pH, UA 5.5 5.0 - 7.5   Color, UA Yellow Yellow   Appearance Ur Clear Clear   Leukocytes,UA Negative Negative   Protein,UA Negative Negative/Trace   Glucose, UA Negative Negative   Ketones, UA Negative Negative   RBC, UA Negative Negative   Bilirubin, UA Negative Negative   Urobilinogen, Ur 0.2 0.2 - 1.0 mg/dL   Nitrite, UA Negative Negative   Microscopic Examination Comment   CBC with Differential/Platelet   Collection Time: 08/08/23 11:32 AM  Result Value Ref Range   WBC 7.3 3.4 - 10.8 x10E3/uL   RBC 4.50 3.77 - 5.28 x10E6/uL   Hemoglobin 13.6 11.1 - 15.9 g/dL  Hematocrit 39.8 34.0 - 46.6 %   MCV 88 79 - 97 fL   MCH 30.2 26.6 - 33.0 pg   MCHC 34.2 31.5 - 35.7 g/dL   RDW 86.3 88.2 - 84.5 %   Platelets 250 150 - 450 x10E3/uL   Neutrophils 66 Not Estab. %   Lymphs 26 Not Estab. %   Monocytes 6 Not Estab. %   Eos 1 Not Estab. %   Basos 1 Not Estab. %   Neutrophils Absolute 4.8 1.4 - 7.0 x10E3/uL   Lymphocytes Absolute 1.9 0.7 - 3.1 x10E3/uL    Monocytes Absolute 0.4 0.1 - 0.9 x10E3/uL   EOS (ABSOLUTE) 0.1 0.0 - 0.4 x10E3/uL   Basophils Absolute 0.1 0.0 - 0.2 x10E3/uL   Immature Granulocytes 0 Not Estab. %   Immature Grans (Abs) 0.0 0.0 - 0.1 x10E3/uL  Comprehensive metabolic panel   Collection Time: 08/08/23 11:32 AM  Result Value Ref Range   Glucose 87 70 - 99 mg/dL   BUN 9 8 - 27 mg/dL   Creatinine, Ser 9.41 0.57 - 1.00 mg/dL   eGFR 898 >40 fO/fpw/8.26   BUN/Creatinine Ratio 16 12 - 28   Sodium 140 134 - 144 mmol/L   Potassium 4.0 3.5 - 5.2 mmol/L   Chloride 105 96 - 106 mmol/L   CO2 21 20 - 29 mmol/L   Calcium 8.8 8.7 - 10.3 mg/dL   Total Protein 6.4 6.0 - 8.5 g/dL   Albumin 4.3 3.9 - 4.9 g/dL   Globulin, Total 2.1 1.5 - 4.5 g/dL   Bilirubin Total 0.7 0.0 - 1.2 mg/dL   Alkaline Phosphatase 103 44 - 121 IU/L   AST 12 0 - 40 IU/L   ALT 13 0 - 32 IU/L  Lipid panel   Collection Time: 08/08/23 11:32 AM  Result Value Ref Range   Cholesterol, Total 146 100 - 199 mg/dL   Triglycerides 889 0 - 149 mg/dL   HDL 45 >60 mg/dL   VLDL Cholesterol Cal 20 5 - 40 mg/dL   LDL Chol Calc (NIH) 81 0 - 99 mg/dL   Chol/HDL Ratio 3.2 0.0 - 4.4 ratio  TSH   Collection Time: 08/08/23 11:32 AM  Result Value Ref Range   TSH 3.350 0.450 - 4.500 uIU/mL      Assessment & Plan:   Problem List Items Addressed This Visit       Cardiovascular and Mediastinum   Essential hypertension - Primary     Follow up plan: No follow-ups on file.

## 2024-02-13 NOTE — Telephone Encounter (Deleted)
 Copied from CRM [id number]. Topic: Referral - Request for Referral >> Feb 13, 2024 10:56 AM Zebedee SAUNDERS wrote: Did the patient discuss referral with their provider in the last year? Yes (If No - schedule appointment) (If Yes - send message)  Appointment offered? Yes  Type of order/referral and detailed reason for visit: Behavioral Health for anxiety  Preference of office, provider, location: [place], [place] area  If referral order, have you been seen by this specialty before? No (If Yes, this issue or another issue? When? Where?  Can we respond through MyChart? No

## 2024-02-13 NOTE — Telephone Encounter (Signed)
 Patient no showed her appointment this morning.  She will need to be seen for referral.

## 2024-02-13 NOTE — Telephone Encounter (Signed)
 Copied from CRM [id number]. Topic: Referral - Request for Referral >> Feb 13, 2024 10:56 AM Zebedee SAUNDERS wrote: Did the patient discuss referral with their provider in the last year? Yes (If No - schedule appointment) (If Yes - send message)  Appointment offered? Yes  Type of order/referral and detailed reason for visit: Behavioral Health for anxiety  Preference of office, provider, location: [place], [place] area  If referral order, have you been seen by this specialty before? No (If Yes, this issue or another issue? When? Where?  Can we respond through MyChart? No

## 2024-02-14 NOTE — Telephone Encounter (Signed)
 Called patient and left vm for her to call back.

## 2024-02-21 NOTE — Telephone Encounter (Signed)
 Called patient left message for patient to call and schedule appt.

## 2024-02-22 NOTE — Telephone Encounter (Signed)
 Called patient and left a message for her to call back to get scheduled.

## 2024-03-14 ENCOUNTER — Encounter: Payer: Self-pay | Admitting: Nurse Practitioner

## 2024-03-14 ENCOUNTER — Ambulatory Visit (INDEPENDENT_AMBULATORY_CARE_PROVIDER_SITE_OTHER): Payer: Self-pay | Admitting: Nurse Practitioner

## 2024-03-14 VITALS — BP 118/78 | HR 65 | Temp 97.6°F | Ht 59.0 in | Wt 166.4 lb

## 2024-03-14 DIAGNOSIS — F411 Generalized anxiety disorder: Secondary | ICD-10-CM

## 2024-03-14 DIAGNOSIS — R7303 Prediabetes: Secondary | ICD-10-CM | POA: Diagnosis not present

## 2024-03-14 DIAGNOSIS — I10 Essential (primary) hypertension: Secondary | ICD-10-CM

## 2024-03-14 DIAGNOSIS — F331 Major depressive disorder, recurrent, moderate: Secondary | ICD-10-CM

## 2024-03-14 MED ORDER — COLCHICINE 0.6 MG PO TABS: 0.6000 mg | ORAL_TABLET | Freq: Every day | ORAL | 1 refills | Status: AC | PRN

## 2024-03-14 MED ORDER — LOSARTAN POTASSIUM 100 MG PO TABS: ORAL_TABLET | ORAL | 1 refills | Status: DC

## 2024-03-14 MED ORDER — AMLODIPINE BESYLATE 5 MG PO TABS: 5.0000 mg | ORAL_TABLET | Freq: Every day | ORAL | 1 refills | Status: DC

## 2024-03-14 MED ORDER — PANTOPRAZOLE SODIUM 20 MG PO TBEC: 20.0000 mg | DELAYED_RELEASE_TABLET | Freq: Every day | ORAL | 1 refills | Status: DC

## 2024-03-14 NOTE — Assessment & Plan Note (Signed)
 Chronic.  Exacerbated at this time.  Having difficulty finding a therapist- hoping getting a referral will help.  Referral placed during visit today.   Labs ordered today.  Return to clinic in 6 months for reevaluation.  Call sooner if concerns arise.

## 2024-03-14 NOTE — Assessment & Plan Note (Signed)
 Chronic.  Controlled.  Continue with current medication regimen.  Labs ordered today.  Return to clinic in 6 months for reevaluation.  Call sooner if concerns arise.  ? ?

## 2024-03-14 NOTE — Assessment & Plan Note (Signed)
 Chronic.  Controlled.  Continue with current medication regimen of Amlodipine  5mg  and Losartan  100mg .  Continue to check blood pressure at home and bring log to next visit.  Refills sent today.  Labs ordered today.  Return to clinic in 6 months for reevaluation.  Call sooner if concerns arise.

## 2024-03-14 NOTE — Progress Notes (Signed)
 BP 118/78   Pulse 65   Temp 97.6 F (36.4 C) (Oral)   Ht 4' 11 (1.499 m)   Wt 166 lb 6.4 oz (75.5 kg)   LMP  (LMP Unknown)   SpO2 98%   BMI 33.61 kg/m    Subjective:    Patient ID: Sandra [name], female    DOB: 20-Jun-1961, 63 y.o.   MRN: [id number]  HPI: Sandra [name] is a 63 y.o. female  Chief Complaint  Patient presents with   Anxiety     Pt would like a referral to a therapist.   Depression   HYPERTENSION without Chronic Kidney Disease Hypertension status: controlled  Satisfied with current treatment? yes Duration of hypertension: years BP monitoring frequency:  daily BP range: 130/78 BP medication side effects:  no Medication compliance: excellent compliance Previous BP meds:amlodipine  and losartan  (cozaar ) Aspirin : no Recurrent headaches: no Visual changes: no Palpitations: no Dyspnea: no Chest pain: no Lower extremity edema: no Dizzy/lightheaded: no   GERD GERD control status: controlled- with pantoprazole  Satisfied with current treatment? no Heartburn frequency: daily  Medication side effects: NA   Medication compliance: NA Previous GERD medications: she has not taken anything for prevention.  Reports current symptoms started about 2 months ago  Antacid use frequency:  daily, using TUMS plus gas  Duration: several hours  Nature: reports feeling like there is a thick mucus or foam she has to cough up  Alleviatiating factors: nothing so far has helped  Aggravating factors: greasy foods seem to aggravate the most  Dysphagia: no Odynophagia:  no Hematemesis: no Blood in stool: no EGD: unsure if she has had one      DEPRESSION/ANXIETY Patient states she would like a referral for a therapist.  She went to [facility] and then didn't get a follow up appointment.  She has an incident at work that has put her in a bad place.   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 Interim medical history since our last visit reviewed. Allergies and medications reviewed and updated.  Review of Systems  Per HPI unless specifically indicated above     Objective:    BP 118/78   Pulse 65   Temp 97.6 F (36.4 C) (Oral)   Ht 4' 11 (1.499 m)   Wt 166 lb 6.4 oz (75.5 kg)   LMP  (LMP Unknown)   SpO2 98%   BMI 33.61 kg/m   Wt Readings from Last 3 Encounters:  03/14/24 166 lb 6.4 oz (75.5 kg)  08/08/23 166 lb 3.2 oz (75.4 kg)  06/28/23 171 lb 6.4 oz (77.7 kg)    Physical Exam Vitals and nursing note reviewed.  Constitutional:      General: She is not in acute distress.    Appearance: Normal appearance. She is normal weight. She is  not ill-appearing, toxic-appearing or diaphoretic.  HENT:     Head: Normocephalic.     Right Ear: External ear normal.     Left Ear: External ear normal.     Nose: Nose normal.     Mouth/Throat:     Mouth: Mucous membranes are moist.     Pharynx: Oropharynx is clear.  Eyes:     General:        Right eye: No discharge.        Left eye: No discharge.     Extraocular Movements: Extraocular movements intact.     Conjunctiva/sclera: Conjunctivae normal.     Pupils: Pupils are equal, round, and reactive to light.  Cardiovascular:     Rate and Rhythm:  Normal rate and regular rhythm.     Heart sounds: No murmur heard. Pulmonary:     Effort: Pulmonary effort is normal. No respiratory distress.     Breath sounds: Normal breath sounds. No wheezing or rales.  Musculoskeletal:     Cervical back: Normal range of motion and neck supple.  Skin:    General: Skin is warm and dry.     Capillary Refill: Capillary refill takes less than 2 seconds.  Neurological:     General: No focal deficit present.     Mental Status: She is alert and oriented to person, place, and time. Mental status is at baseline.  Psychiatric:        Mood and Affect: Mood normal.        Behavior: Behavior normal.        Thought Content: Thought content normal.        Judgment: Judgment normal.     Results for orders placed or performed in visit on 08/08/23  Urinalysis, Routine w reflex microscopic   Collection Time: 08/08/23 11:30 AM  Result Value Ref Range   Specific Gravity, UA 1.020 1.005 - 1.030   pH, UA 5.5 5.0 - 7.5   Color, UA Yellow Yellow   Appearance Ur Clear Clear   Leukocytes,UA Negative Negative   Protein,UA Negative Negative/Trace   Glucose, UA Negative Negative   Ketones, UA Negative Negative   RBC, UA Negative Negative   Bilirubin, UA Negative Negative   Urobilinogen, Ur 0.2 0.2 - 1.0 mg/dL   Nitrite, UA Negative Negative   Microscopic Examination Comment   CBC with Differential/Platelet   Collection Time: 08/08/23 11:32 AM  Result Value Ref Range   WBC 7.3 3.4 - 10.8 x10E3/uL   RBC 4.50 3.77 - 5.28 x10E6/uL   Hemoglobin 13.6 11.1 - 15.9 g/dL   Hematocrit 60.1 65.9 - 46.6 %   MCV 88 79 - 97 fL   MCH 30.2 26.6 - 33.0 pg   MCHC 34.2 31.5 - 35.7 g/dL   RDW 86.3 88.2 - 84.5 %   Platelets 250 150 - 450 x10E3/uL   Neutrophils 66 Not Estab. %   Lymphs 26 Not Estab. %   Monocytes 6 Not Estab. %   Eos 1 Not Estab. %   Basos 1 Not Estab. %   Neutrophils Absolute 4.8 1.4 - 7.0 x10E3/uL   Lymphocytes Absolute 1.9 0.7 - 3.1 x10E3/uL   Monocytes  Absolute 0.4 0.1 - 0.9 x10E3/uL   EOS (ABSOLUTE) 0.1 0.0 - 0.4 x10E3/uL   Basophils Absolute 0.1 0.0 - 0.2 x10E3/uL   Immature Granulocytes 0 Not Estab. %   Immature Grans (Abs) 0.0 0.0 - 0.1 x10E3/uL  Comprehensive metabolic panel   Collection Time: 08/08/23 11:32 AM  Result Value Ref Range   Glucose 87 70 - 99 mg/dL   BUN 9 8 - 27 mg/dL   Creatinine, Ser 9.41 0.57 - 1.00 mg/dL   eGFR 898 >40 fO/fpw/8.26   BUN/Creatinine Ratio 16 12 - 28   Sodium 140 134 - 144 mmol/L   Potassium 4.0 3.5 - 5.2 mmol/L   Chloride 105 96 - 106 mmol/L   CO2 21 20 - 29 mmol/L   Calcium 8.8 8.7 - 10.3 mg/dL   Total Protein 6.4 6.0 - 8.5 g/dL   Albumin 4.3 3.9 - 4.9 g/dL   Globulin, Total 2.1 1.5 - 4.5 g/dL   Bilirubin Total 0.7 0.0 - 1.2 mg/dL   Alkaline Phosphatase 103 44 - 121 IU/L   AST 12 0 - 40 IU/L   ALT 13 0 - 32 IU/L  Lipid panel   Collection Time: 08/08/23 11:32 AM  Result Value Ref Range   Cholesterol, Total 146 100 - 199 mg/dL   Triglycerides 889 0 - 149 mg/dL   HDL 45 >60 mg/dL   VLDL Cholesterol Cal 20 5 - 40 mg/dL   LDL Chol Calc (NIH) 81 0 - 99 mg/dL   Chol/HDL Ratio 3.2 0.0 - 4.4 ratio  TSH   Collection Time: 08/08/23 11:32 AM  Result Value Ref Range   TSH 3.350 0.450 - 4.500 uIU/mL      Assessment & Plan:   Problem List Items Addressed This Visit       Cardiovascular and Mediastinum   Essential hypertension   Chronic.  Controlled.  Continue with current medication regimen of Amlodipine  5mg  and Losartan  100mg .  Continue to check blood pressure at home and bring log to next visit.  Refills sent today.  Labs ordered today.  Return to clinic in 6 months for reevaluation.  Call sooner if concerns arise.       Relevant Medications   losartan  (COZAAR ) 100 MG tablet   amLODipine  (NORVASC ) 5 MG tablet   Other Relevant Orders   Comprehensive metabolic panel with GFR     Other   Generalized anxiety disorder   Chronic.  Exacerbated at this time.  Having difficulty finding a  therapist- hoping getting a referral will help.  Referral placed during visit today.   Labs ordered today.  Return to clinic in 6 months for reevaluation.  Call sooner if concerns arise.       Relevant Orders   Ambulatory referral to Psychology   Moderate episode of recurrent major depressive disorder (HCC)   Chronic.  Exacerbated at this time.  Having difficulty finding a therapist- hoping getting a referral will help.  Referral placed during visit today.   Labs ordered today.  Return to clinic in 6 months for reevaluation.  Call sooner if concerns arise.       Relevant Orders   Ambulatory referral to Psychology   Prediabetes - Primary   Chronic.  Controlled.  Continue with current medication regimen.  Labs ordered today.  Return to clinic in 6 months for reevaluation.  Call sooner if concerns arise.        Relevant Orders   HgB A1c     Follow up plan: Return in about 3 months (around 06/14/2024) for Welcome to Medicare and Physical and Fasting labs.

## 2024-03-14 NOTE — Progress Notes (Deleted)
 LMP  (LMP Unknown)    Subjective:    Patient ID: Sandra [name], female    DOB: 1961/01/01, 63 y.o.   MRN: [id number]  HPI: Sandra [name] is a 63 y.o. female  No chief complaint on file.   Relevant past medical, surgical, family and social history reviewed and updated as indicated. Interim medical history since our last visit reviewed. Allergies and medications reviewed and updated.  Review of Systems  Per HPI unless specifically indicated above     Objective:    LMP  (LMP Unknown)   Wt Readings from Last 3 Encounters:  08/08/23 166 lb 3.2 oz (75.4 kg)  06/28/23 171 lb 6.4 oz (77.7 kg)  03/14/23 168 lb 6.4 oz (76.4 kg)    Physical Exam  Results for orders placed or performed in visit on 08/08/23  Urinalysis, Routine w reflex microscopic   Collection Time: 08/08/23 11:30 AM  Result Value Ref Range   Specific Gravity, UA 1.020 1.005 - 1.030   pH, UA 5.5 5.0 - 7.5   Color, UA Yellow Yellow   Appearance Ur Clear Clear   Leukocytes,UA Negative Negative   Protein,UA Negative Negative/Trace   Glucose, UA Negative Negative   Ketones, UA Negative Negative   RBC, UA Negative Negative   Bilirubin, UA Negative Negative   Urobilinogen, Ur 0.2 0.2 - 1.0 mg/dL   Nitrite, UA Negative Negative   Microscopic Examination Comment   CBC with Differential/Platelet   Collection Time: 08/08/23 11:32 AM  Result Value Ref Range   WBC 7.3 3.4 - 10.8 x10E3/uL   RBC 4.50 3.77 - 5.28 x10E6/uL   Hemoglobin 13.6 11.1 - 15.9 g/dL   Hematocrit 60.1 65.9 - 46.6 %   MCV 88 79 - 97 fL   MCH 30.2 26.6 - 33.0 pg   MCHC 34.2 31.5 - 35.7 g/dL   RDW 86.3 88.2 - 84.5 %   Platelets 250 150 - 450 x10E3/uL   Neutrophils 66 Not Estab. %   Lymphs 26 Not Estab. %   Monocytes 6 Not Estab. %   Eos 1 Not Estab. %   Basos 1 Not Estab. %   Neutrophils Absolute 4.8 1.4 - 7.0 x10E3/uL   Lymphocytes Absolute 1.9 0.7 - 3.1 x10E3/uL   Monocytes Absolute 0.4 0.1 - 0.9 x10E3/uL   EOS (ABSOLUTE) 0.1 0.0 - 0.4  x10E3/uL   Basophils Absolute 0.1 0.0 - 0.2 x10E3/uL   Immature Granulocytes 0 Not Estab. %   Immature Grans (Abs) 0.0 0.0 - 0.1 x10E3/uL  Comprehensive metabolic panel   Collection Time: 08/08/23 11:32 AM  Result Value Ref Range   Glucose 87 70 - 99 mg/dL   BUN 9 8 - 27 mg/dL   Creatinine, Ser 9.41 0.57 - 1.00 mg/dL   eGFR 898 >40 fO/fpw/8.26   BUN/Creatinine Ratio 16 12 - 28   Sodium 140 134 - 144 mmol/L   Potassium 4.0 3.5 - 5.2 mmol/L   Chloride 105 96 - 106 mmol/L   CO2 21 20 - 29 mmol/L   Calcium 8.8 8.7 - 10.3 mg/dL   Total Protein 6.4 6.0 - 8.5 g/dL   Albumin 4.3 3.9 - 4.9 g/dL   Globulin, Total 2.1 1.5 - 4.5 g/dL   Bilirubin Total 0.7 0.0 - 1.2 mg/dL   Alkaline Phosphatase 103 44 - 121 IU/L   AST 12 0 - 40 IU/L   ALT 13 0 - 32 IU/L  Lipid panel   Collection Time: 08/08/23 11:32 AM  Result Value  Ref Range   Cholesterol, Total 146 100 - 199 mg/dL   Triglycerides 889 0 - 149 mg/dL   HDL 45 >60 mg/dL   VLDL Cholesterol Cal 20 5 - 40 mg/dL   LDL Chol Calc (NIH) 81 0 - 99 mg/dL   Chol/HDL Ratio 3.2 0.0 - 4.4 ratio  TSH   Collection Time: 08/08/23 11:32 AM  Result Value Ref Range   TSH 3.350 0.450 - 4.500 uIU/mL      Assessment & Plan:   Problem List Items Addressed This Visit   None    Follow up plan: No follow-ups on file.

## 2024-03-15 ENCOUNTER — Ambulatory Visit: Payer: Self-pay | Admitting: Nurse Practitioner

## 2024-03-15 LAB — HEMOGLOBIN A1C
Est. average glucose Bld gHb Est-mCnc: 108 mg/dL
Hgb A1c MFr Bld: 5.4 % (ref 4.8–5.6)

## 2024-03-15 LAB — COMPREHENSIVE METABOLIC PANEL WITH GFR
ALT: 13 IU/L (ref 0–32)
AST: 15 IU/L (ref 0–40)
Albumin: 4.5 g/dL (ref 3.9–4.9)
Alkaline Phosphatase: 88 IU/L (ref 44–121)
BUN/Creatinine Ratio: 23 (ref 12–28)
BUN: 14 mg/dL (ref 8–27)
Bilirubin Total: 0.6 mg/dL (ref 0.0–1.2)
CO2: 20 mmol/L (ref 20–29)
Calcium: 9 mg/dL (ref 8.7–10.3)
Chloride: 105 mmol/L (ref 96–106)
Creatinine, Ser: 0.62 mg/dL (ref 0.57–1.00)
Globulin, Total: 2.2 g/dL (ref 1.5–4.5)
Glucose: 81 mg/dL (ref 70–99)
Potassium: 3.9 mmol/L (ref 3.5–5.2)
Sodium: 141 mmol/L (ref 134–144)
Total Protein: 6.7 g/dL (ref 6.0–8.5)
eGFR: 99 mL/min/1.73 (ref >59)

## 2024-05-04 DIAGNOSIS — E785 Hyperlipidemia, unspecified: Secondary | ICD-10-CM | POA: Diagnosis not present

## 2024-05-04 DIAGNOSIS — I10 Essential (primary) hypertension: Secondary | ICD-10-CM | POA: Diagnosis not present

## 2024-05-04 DIAGNOSIS — R7309 Other abnormal glucose: Secondary | ICD-10-CM | POA: Diagnosis not present

## 2024-06-15 ENCOUNTER — Ambulatory Visit (INDEPENDENT_AMBULATORY_CARE_PROVIDER_SITE_OTHER): Admitting: Nurse Practitioner

## 2024-06-15 ENCOUNTER — Encounter: Payer: Self-pay | Admitting: Nurse Practitioner

## 2024-06-15 VITALS — BP 125/79 | HR 81 | Temp 98.3°F | Ht 59.0 in | Wt 165.6 lb

## 2024-06-15 DIAGNOSIS — Z7189 Other specified counseling: Secondary | ICD-10-CM | POA: Insufficient documentation

## 2024-06-15 DIAGNOSIS — F411 Generalized anxiety disorder: Secondary | ICD-10-CM

## 2024-06-15 DIAGNOSIS — I10 Essential (primary) hypertension: Secondary | ICD-10-CM | POA: Diagnosis not present

## 2024-06-15 DIAGNOSIS — Z0000 Encounter for general adult medical examination without abnormal findings: Secondary | ICD-10-CM

## 2024-06-15 DIAGNOSIS — R7303 Prediabetes: Secondary | ICD-10-CM

## 2024-06-15 DIAGNOSIS — Z6835 Body mass index (BMI) 35.0-35.9, adult: Secondary | ICD-10-CM

## 2024-06-15 DIAGNOSIS — Z136 Encounter for screening for cardiovascular disorders: Secondary | ICD-10-CM | POA: Diagnosis not present

## 2024-06-15 DIAGNOSIS — Z1231 Encounter for screening mammogram for malignant neoplasm of breast: Secondary | ICD-10-CM

## 2024-06-15 DIAGNOSIS — K219 Gastro-esophageal reflux disease without esophagitis: Secondary | ICD-10-CM

## 2024-06-15 DIAGNOSIS — F331 Major depressive disorder, recurrent, moderate: Secondary | ICD-10-CM

## 2024-06-15 DIAGNOSIS — Z1211 Encounter for screening for malignant neoplasm of colon: Secondary | ICD-10-CM

## 2024-06-15 MED ORDER — AMLODIPINE BESYLATE 5 MG PO TABS: 5.0000 mg | ORAL_TABLET | Freq: Every day | ORAL | 1 refills | Status: AC

## 2024-06-15 MED ORDER — LOSARTAN POTASSIUM 100 MG PO TABS: ORAL_TABLET | ORAL | 1 refills | Status: AC

## 2024-06-15 NOTE — Assessment & Plan Note (Signed)
 A voluntary discussion about advance care planning including the explanation and discussion of advance directives was extensively discussed  with the patient for 5 minutes with patient. Explanation about the health care proxy and Living will was reviewed and packet with forms with explanation of how to fill them out was given.  During this discussion, the patient was able to identify a health care proxy as her sister but she needs to change it due to her sister passing away.

## 2024-06-15 NOTE — Assessment & Plan Note (Signed)
 Chronic.  Controlled.  Continue with current medication regimen of Amlodipine  5mg  and Losartan  100mg .  Recommend checking blood pressures at home and bringing log to next visit.  Refills sent today.  Labs ordered today.  Return to clinic in 6 months for reevaluation.  Call sooner if concerns arise.

## 2024-06-15 NOTE — Assessment & Plan Note (Signed)
 Chronic.  Controlled.  Continue with current medication regimen.  Labs ordered today.  Return to clinic in 6 months for reevaluation.  Call sooner if concerns arise.  ? ?

## 2024-06-15 NOTE — Assessment & Plan Note (Signed)
 Chronic.  Controlled without medication..  Labs ordered today.  Return to clinic in 6 months for reevaluation.  Call sooner if concerns arise.

## 2024-06-15 NOTE — Progress Notes (Signed)
 BP 125/79   Pulse 81   Temp 98.3 F (36.8 C) (Oral)   Ht 4' 11 (1.499 m)   Wt 165 lb 9.6 oz (75.1 kg)   LMP  (LMP Unknown)   SpO2 98%   BMI 33.45 kg/m    Subjective:    Patient ID: Sandra [name], female    DOB: [birth year], 63 y.o.   MRN: [id number]  HPI: Sandra [name] is a 63 y.o. female presenting on 06/15/2024 for comprehensive medical examination. Current medical complaints include:none  She currently lives with: Menopausal Symptoms: no  HYPERTENSION without Chronic Kidney Disease Hypertension status: controlled  Satisfied with current treatment? yes Duration of hypertension: years BP monitoring frequency:  weekly BP range: 132/73 BP medication side effects:  no Medication compliance: excellent compliance Previous BP meds:amlodipine  and losartan  (cozaar ) Aspirin : no Recurrent headaches: no Visual changes: no Palpitations: no Dyspnea: no Chest pain: no Lower extremity edema: no Dizzy/lightheaded: no   DEPRESSION/ANXIETY Patient states she is doing great.  She won her case which has relieved all her stress.     Depression Screen done today and results listed below:     06/15/2024    8:59 AM 06/15/2024    8:39 AM 03/14/2024    8:45 AM 08/08/2023   10:54 AM 06/28/2023    2:31 PM  Depression screen PHQ 2/9  Decreased Interest 0 0 1 0 1  Down, Depressed, Hopeless 0 0 3 0 0  PHQ - 2 Score 0 0 4 0 1  Altered sleeping 0  2 0 0  Tired, decreased energy 0  1 0 0  Change in appetite 0  3 3 3   Feeling bad or failure about yourself  0  3 0 0  Trouble concentrating 0  3 1 0  Moving slowly or fidgety/restless 0  2 0 0  Suicidal thoughts 0  3 0 0  PHQ-9 Score 0  21  4  4    Difficult doing work/chores Not difficult at all  Somewhat difficult Not difficult at all Not difficult at all     Data saved with a previous flowsheet row definition    The patient does not have a history of falls. I did complete a risk assessment for falls. A plan of care for falls was  documented.   Past Medical History:  Past Medical History:  Diagnosis Date   Anxiety    Arthritis    Depression    Gout    Hypertension    Obesity     Surgical History:  Past Surgical History:  Procedure Laterality Date   APPENDECTOMY     CESAREAN SECTION     x 2   CHOLECYSTECTOMY  1987    Medications:  Current Outpatient Medications on File Prior to Visit  Medication Sig   colchicine  0.6 MG tablet Take 1 tablet (0.6 mg total) by mouth daily as needed.   No current facility-administered medications on file prior to visit.    Allergies:  Allergies  Allergen Reactions   Clarithromycin Anaphylaxis and Swelling    Pt took one tablet of biaxin at least 10 years ago and her Tongue immediately swelled. Pt took one tablet of biaxin at least 10 years ago and her Tongue immediately swelled.    Allopurinol Hives and Other (See Comments)   Lisinopril Hives   Prednisone Hives and Other (See Comments)   Other Other (See Comments)    Peanut butter - trouble swallowing   Penicillins Rash    Social  History:  Social History   Socioeconomic History   Marital status: Widowed    Spouse name: Not on file   Number of children: Not on file   Years of education: Not on file   Highest education level: Not on file  Occupational History   Not on file  Tobacco Use   Smoking status: Never   Smokeless tobacco: Never  Vaping Use   Vaping status: Never Used  Substance and Sexual Activity   Alcohol use: No   Drug use: No   Sexual activity: Not Currently  Other Topics Concern   Not on file  Social History Narrative   Not on file   Social Drivers of Health   Financial Resource Strain: High Risk (08/08/2023)   Overall Financial Resource Strain (CARDIA)    Difficulty of Paying Living Expenses: Hard  Food Insecurity: Food Insecurity Present (06/15/2024)   Hunger Vital Sign    Worried About Running Out of Food in the Last Year: Never true    Ran Out of Food in the Last Year:  Sometimes true  Transportation Needs: No Transportation Needs (06/15/2024)   PRAPARE - Administrator, Civil Service (Medical): No    Lack of Transportation (Non-Medical): No  Physical Activity: Insufficiently Active (06/15/2024)   Exercise Vital Sign    Days of Exercise per Week: 1 day    Minutes of Exercise per Session: 60 min  Stress: No Stress Concern Present (06/15/2024)   [employer] of Occupational Health - Occupational Stress Questionnaire    Feeling of Stress: Only a little  Social Connections: Moderately Integrated (06/15/2024)   Social Connection and Isolation Panel    Frequency of Communication with Friends and Family: More than three times a week    Frequency of Social Gatherings with Friends and Family: Twice a week    Attends Religious Services: More than 4 times per year    Active Member of [employer] or Organizations: Yes    Attends Banker Meetings: More than 4 times per year    Marital Status: Widowed  Intimate Partner Violence: Not At Risk (06/15/2024)   Humiliation, Afraid, Rape, and Kick questionnaire    Fear of Current or Ex-Partner: No    Emotionally Abused: No    Physically Abused: No    Sexually Abused: No   Social History   Tobacco Use  Smoking Status Never  Smokeless Tobacco Never   Social History   Substance and Sexual Activity  Alcohol Use No    Family History:  Family History  Problem Relation Age of Onset   Breast cancer Mother 21   Heart disease Mother    Heart attack Mother    Hypertension Mother    Cancer Mother        breast   Stroke Mother    Heart disease Father    Heart attack Father     Past medical history, surgical history, medications, allergies, family history and social history reviewed with patient today and changes made to appropriate areas of the chart.   Review of Systems  Eyes:  Negative for blurred vision and double vision.  Respiratory:  Negative for shortness of breath.   Cardiovascular:   Negative for chest pain, palpitations and leg swelling.  Neurological:  Negative for dizziness and headaches.  Psychiatric/Behavioral:  Negative for depression. The patient is not nervous/anxious.    All other ROS negative except what is listed above and in the HPI.      Objective:  BP 125/79   Pulse 81   Temp 98.3 F (36.8 C) (Oral)   Ht 4' 11 (1.499 m)   Wt 165 lb 9.6 oz (75.1 kg)   LMP  (LMP Unknown)   SpO2 98%   BMI 33.45 kg/m   Wt Readings from Last 3 Encounters:  06/15/24 165 lb 9.6 oz (75.1 kg)  03/14/24 166 lb 6.4 oz (75.5 kg)  08/08/23 166 lb 3.2 oz (75.4 kg)    Physical Exam Vitals and nursing note reviewed.  Constitutional:      General: She is awake. She is not in acute distress.    Appearance: She is well-developed. She is not ill-appearing.  HENT:     Head: Normocephalic and atraumatic.     Right Ear: Hearing, tympanic membrane, ear canal and external ear normal. No drainage.     Left Ear: Hearing, tympanic membrane, ear canal and external ear normal. No drainage.     Nose: Nose normal.     Right Sinus: No maxillary sinus tenderness or frontal sinus tenderness.     Left Sinus: No maxillary sinus tenderness or frontal sinus tenderness.     Mouth/Throat:     Mouth: Mucous membranes are moist.     Pharynx: Oropharynx is clear. Uvula midline. No pharyngeal swelling, oropharyngeal exudate or posterior oropharyngeal erythema.  Eyes:     General: Lids are normal.        Right eye: No discharge.        Left eye: No discharge.     Extraocular Movements: Extraocular movements intact.     Conjunctiva/sclera: Conjunctivae normal.     Pupils: Pupils are equal, round, and reactive to light.     Visual Fields: Right eye visual fields normal and left eye visual fields normal.  Neck:     Thyroid: No thyromegaly.     Vascular: No carotid bruit.     Trachea: Trachea normal.  Cardiovascular:     Rate and Rhythm: Normal rate and regular rhythm.     Heart sounds:  Normal heart sounds. No murmur heard.    No gallop.  Pulmonary:     Effort: Pulmonary effort is normal. No accessory muscle usage or respiratory distress.     Breath sounds: Normal breath sounds.  Chest:  Breasts:    Right: Normal.     Left: Normal.  Abdominal:     General: Bowel sounds are normal.     Palpations: Abdomen is soft. There is no hepatomegaly or splenomegaly.     Tenderness: There is no abdominal tenderness.  Musculoskeletal:        General: Normal range of motion.     Cervical back: Normal range of motion and neck supple.     Right lower leg: No edema.     Left lower leg: No edema.  Lymphadenopathy:     Head:     Right side of head: No submental, submandibular, tonsillar, preauricular or posterior auricular adenopathy.     Left side of head: No submental, submandibular, tonsillar, preauricular or posterior auricular adenopathy.     Cervical: No cervical adenopathy.     Upper Body:     Right upper body: No supraclavicular, axillary or pectoral adenopathy.     Left upper body: No supraclavicular, axillary or pectoral adenopathy.  Skin:    General: Skin is warm and dry.     Capillary Refill: Capillary refill takes less than 2 seconds.     Findings: No rash.  Neurological:  Mental Status: She is alert and oriented to person, place, and time.     Gait: Gait is intact.  Psychiatric:        Attention and Perception: Attention normal.        Mood and Affect: Mood normal.        Speech: Speech normal.        Behavior: Behavior normal. Behavior is cooperative.        Thought Content: Thought content normal.        Judgment: Judgment normal.     Results for orders placed or performed in visit on 03/14/24  Comprehensive metabolic panel with GFR   Collection Time: 03/14/24  9:01 AM  Result Value Ref Range   Glucose 81 70 - 99 mg/dL   BUN 14 8 - 27 mg/dL   Creatinine, Ser 9.37 0.57 - 1.00 mg/dL   eGFR 99 >40 fO/fpw/8.26   BUN/Creatinine Ratio 23 12 - 28   Sodium  141 134 - 144 mmol/L   Potassium 3.9 3.5 - 5.2 mmol/L   Chloride 105 96 - 106 mmol/L   CO2 20 20 - 29 mmol/L   Calcium 9.0 8.7 - 10.3 mg/dL   Total Protein 6.7 6.0 - 8.5 g/dL   Albumin 4.5 3.9 - 4.9 g/dL   Globulin, Total 2.2 1.5 - 4.5 g/dL   Bilirubin Total 0.6 0.0 - 1.2 mg/dL   Alkaline Phosphatase 88 44 - 121 IU/L   AST 15 0 - 40 IU/L   ALT 13 0 - 32 IU/L  HgB A1c   Collection Time: 03/14/24  9:01 AM  Result Value Ref Range   Hgb A1c MFr Bld 5.4 4.8 - 5.6 %   Est. average glucose Bld gHb Est-mCnc 108 mg/dL      Assessment & Plan:   Problem List Items Addressed This Visit       Cardiovascular and Mediastinum   Essential hypertension   Chronic.  Controlled.  Continue with current medication regimen of Amlodipine  5mg  and Losartan  100mg .  Recommend checking blood pressures at home and bringing log to next visit.  Refills sent today.  Labs ordered today.  Return to clinic in 6 months for reevaluation.  Call sooner if concerns arise.       Relevant Medications   amLODipine  (NORVASC ) 5 MG tablet   losartan  (COZAAR ) 100 MG tablet   Other Relevant Orders   Comprehensive metabolic panel with GFR     Digestive   Gastroesophageal reflux disease   Chronic.  Controlled with PRN use of Pantoprazole .  Labs ordered today.  Return to clinic in 6 months for reevaluation.  Call sooner if concerns arise.           Other   BMI 35.0-35.9,adult   Recommended eating smaller high protein, low fat meals more frequently and exercising 30 mins a day 5 times a week with a goal of 10-15lb weight loss in the next 3 months.       Generalized anxiety disorder   Chronic.  Controlled without medication.  Labs ordered today.  Return to clinic in 6 months for reevaluation.  Call sooner if concerns arise.        Moderate episode of recurrent major depressive disorder (HCC)   Chronic.  Controlled without medication.  Labs ordered today.  Return to clinic in 6 months for reevaluation.  Call sooner if  concerns arise.        Prediabetes   Chronic.  Controlled.  Continue with current medication  regimen.  Labs ordered today.  Return to clinic in 6 months for reevaluation.  Call sooner if concerns arise.        Relevant Orders   Hemoglobin A1c   Advanced care planning/counseling discussion   A voluntary discussion about advance care planning including the explanation and discussion of advance directives was extensively discussed  with the patient for 5 minutes with patient. Explanation about the health care proxy and Living will was reviewed and packet with forms with explanation of how to fill them out was given.  During this discussion, the patient was able to identify a health care proxy as her sister but she needs to change it due to her sister passing away.         Other Visit Diagnoses       Welcome to Medicare preventive visit    -  Primary   EKG showed NSR. Reviewed HM.  Screenings can be viewed in note.   Relevant Orders   EKG 12-Lead     Annual physical exam       Health maintenance reviewed during visit today.  Labs ordered.  Vaccines reviewed.  Mammogram and colonoscopy ordered. Will get PAP at next visit.   Relevant Orders   CBC with Differential/Platelet   Comprehensive metabolic panel with GFR   Lipid panel   TSH   Hemoglobin A1c     Screening for ischemic heart disease       Relevant Orders   Lipid panel     Encounter for screening mammogram for malignant neoplasm of breast       Relevant Orders   MM 3D SCREENING MAMMOGRAM BILATERAL BREAST     Screening for colon cancer       Relevant Orders   Ambulatory referral to Gastroenterology        Follow up plan: Return in about 6 months (around 12/13/2024) for HTN, HLD, DM2 FU and PAP.   LABORATORY TESTING:  - Pap smear: done elsewhere  IMMUNIZATIONS:   - Tdap: Tetanus vaccination status reviewed: last tetanus booster within 10 years. - Influenza: Refused - Pneumovax: Refused - Prevnar: Refused - COVID:  Refused - HPV: NA - Shingrix vaccine: Refused  SCREENING: -Mammogram: Ordered today - Colonoscopy: Ordered today  - Bone Density: Discussed at visit today -Hearing Test: Done today -Spirometry: NA  PATIENT COUNSELING:   Advised to take 1 mg of folate supplement per day if capable of pregnancy.   Sexuality: Discussed sexually transmitted diseases, partner selection, use of condoms, avoidance of unintended pregnancy  and contraceptive alternatives.   Advised to avoid cigarette smoking.  I discussed with the patient that most people either abstain from alcohol or drink within safe limits (<=14/week and <=4 drinks/occasion for males, <=7/weeks and <= 3 drinks/occasion for females) and that the risk for alcohol disorders and other health effects rises proportionally with the number of drinks per week and how often a drinker exceeds daily limits.  Discussed cessation/primary prevention of drug use and availability of treatment for abuse.   Diet: Encouraged to adjust caloric intake to maintain  or achieve ideal body weight, to reduce intake of dietary saturated fat and total fat, to limit sodium intake by avoiding high sodium foods and not adding table salt, and to maintain adequate dietary potassium and calcium preferably from fresh fruits, vegetables, and low-fat dairy products.    stressed the importance of regular exercise  Injury prevention: Discussed safety belts, safety helmets, smoke detector, smoking near bedding or upholstery.  Dental health: Discussed importance of regular tooth brushing, flossing, and dental visits.    NEXT PREVENTATIVE PHYSICAL DUE IN 1 YEAR. Return in about 6 months (around 12/13/2024) for HTN, HLD, DM2 FU and PAP.

## 2024-06-15 NOTE — Assessment & Plan Note (Signed)
 Recommended eating smaller high protein, low fat meals more frequently and exercising 30 mins a day 5 times a week with a goal of 10-15lb weight loss in the next 3 months.

## 2024-06-15 NOTE — Assessment & Plan Note (Signed)
 Chronic.  Controlled with PRN use of Pantoprazole .  Labs ordered today.  Return to clinic in 6 months for reevaluation.  Call sooner if concerns arise.

## 2024-06-15 NOTE — Progress Notes (Addendum)
 Subjective:   Atianna Haidar is a 63 y.o. female who presents for a Welcome to Medicare Exam.   Allergies (verified) Clarithromycin, Allopurinol, Lisinopril, Prednisone, Other, and Penicillins   History: Past Medical History:  Diagnosis Date   Anxiety    Arthritis    Depression    Gout    Hypertension    Obesity    Past Surgical History:  Procedure Laterality Date   APPENDECTOMY     CESAREAN SECTION     x 2   CHOLECYSTECTOMY  1987   Family History  Problem Relation Age of Onset   Breast cancer Mother 46   Heart disease Mother    Heart attack Mother    Hypertension Mother    Cancer Mother        breast   Stroke Mother    Heart disease Father    Heart attack Father    Social History   Occupational History   Not on file  Tobacco Use   Smoking status: Never   Smokeless tobacco: Never  Vaping Use   Vaping status: Never Used  Substance and Sexual Activity   Alcohol use: No   Drug use: No   Sexual activity: Not Currently   Tobacco Counseling Counseling given: Not Answered  SDOH Screenings   Food Insecurity: Food Insecurity Present (06/15/2024)  Housing: Low Risk  (06/15/2024)  Transportation Needs: No Transportation Needs (06/15/2024)  Utilities: Not At Risk (06/15/2024)  Alcohol Screen: Low Risk  (08/08/2023)  Depression (PHQ2-9): Low Risk  (06/15/2024)  Financial Resource Strain: High Risk (08/08/2023)  Physical Activity: Insufficiently Active (06/15/2024)  Social Connections: Moderately Integrated (06/15/2024)  Stress: No Stress Concern Present (06/15/2024)  Tobacco Use: Low Risk  (06/15/2024)  Health Literacy: Adequate Health Literacy (06/15/2024)   Depression Screen    06/15/2024    8:59 AM 06/15/2024    8:39 AM 03/14/2024    8:45 AM 08/08/2023   10:54 AM 06/28/2023    2:31 PM 03/14/2023    4:51 PM 08/03/2022    9:45 AM  PHQ 2/9 Scores  PHQ - 2 Score 0 0 4 0 1 4 1   PHQ- 9 Score 0  21  4  4  17  9       Data saved with a previous flowsheet row definition      Goals Addressed   None    Visit info / Clinical Intake: Medicare Wellness Visit Type:: Welcome to [employer] GOVERNMENT SOCIAL RESEARCH OFFICER) Medicare Wellness Visit Mode:: In-person (required for WTM) Interpreter Needed?: No Pre-visit prep was completed: yes AWV questionnaire completed by patient prior to visit?: no Living arrangements:: (!) lives alone Patient's Overall Health Status Rating: excellent Typical amount of pain: none Does pain affect daily life?: no Are you currently prescribed opioids?: no  Dietary Habits and Nutritional Risks How many meals a day?: 3 Eats fruit and vegetables daily?: yes Most meals are obtained by: preparing own meals Diabetic:: no  Functional Status Activities of Daily Living (to include ambulation/medication): Independent Ambulation: Independent Medication Administration: Independent Home Management: Independent Manage your own finances?: yes Primary transportation is: driving Concerns about vision?: no *vision screening is required for WTM* Concerns about hearing?: no  Fall Screening Falls in the past year?: 0 Number of falls in past year: 0 Was there an injury with Fall?: 0 Fall Risk Category Calculator: 0 Patient Fall Risk Level: Low Fall Risk  Fall Risk Patient at Risk for Falls Due to: No Fall Risks Fall risk Follow up: Falls evaluation completed  Home  and Transportation Safety: All rugs have non-skid backing?: N/A, no rugs All stairs or steps have railings?: (!) no Grab bars in the bathtub or shower?: yes Have non-skid surface in bathtub or shower?: yes Good home lighting?: yes Regular seat belt use?: yes Hospital stays in the last year:: no  Cognitive Assessment Difficulty concentrating, remembering, or making decisions? : no Will 6CIT or Mini Cog be Completed: no 6CIT or Mini Cog Declined: patient alert, oriented, able to answer questions appropriately and recall recent events  Advance Directives (For Healthcare) Does Patient Have a  Medical Advance Directive?: No Would patient like information on creating a medical advance directive?: Yes (Inpatient - patient defers creating a medical advance directive at this time - Information given)  Reviewed/Updated  Reviewed/Updated: All         Objective:    Today's Vitals   06/15/24 0832  BP: 125/79  Pulse: 81  Temp: 98.3 F (36.8 C)  TempSrc: Oral  SpO2: 98%  Weight: 165 lb 9.6 oz (75.1 kg)  Height: 4' 11 (1.499 m)  PainSc: 0-No pain   Body mass index is 33.45 kg/m.     Current Medications (verified) Outpatient Encounter Medications as of 06/15/2024  Medication Sig   colchicine  0.6 MG tablet Take 1 tablet (0.6 mg total) by mouth daily as needed.   [DISCONTINUED] amLODipine  (NORVASC ) 5 MG tablet Take 1 tablet (5 mg total) by mouth daily.   [DISCONTINUED] losartan  (COZAAR ) 100 MG tablet Take 1 tablet by mouth in the evening   amLODipine  (NORVASC ) 5 MG tablet Take 1 tablet (5 mg total) by mouth daily.   losartan  (COZAAR ) 100 MG tablet Take 1 tablet by mouth in the evening   [DISCONTINUED] pantoprazole  (PROTONIX ) 20 MG tablet Take 1 tablet (20 mg total) by mouth daily. (Patient not taking: Reported on 06/15/2024)   No facility-administered encounter medications on file as of 06/15/2024.   Hearing/Vision screen Hearing Screening   500Hz  1000Hz  2000Hz  3000Hz   Right ear 40 Fail Fail Fail  Left ear 40 Fail 25 40   Vision Screening   Right eye Left eye Both eyes  Without correction 20/25 20/50 20/20   With correction      Immunizations and Health Maintenance Health Maintenance  Topic Date Due   Colonoscopy  Never done   DEXA SCAN  Never done   Mammogram  08/04/2024   COVID-19 Vaccine (4 - 2025-26 season) 07/01/2024 (Originally 04/09/2024)   Zoster Vaccines- Shingrix (1 of 2) 09/15/2024 (Originally 12/29/2008)   Influenza Vaccine  11/06/2024 (Originally 03/09/2024)   Cervical Cancer Screening (HPV/Pap Cotest)  06/15/2025 (Originally 10/30/2016)   Pneumococcal  Vaccine: 50+ Years (1 of 1 - PCV) 06/15/2025 (Originally 12/29/2008)   Medicare Annual Wellness (AWV)  06/15/2025   DTaP/Tdap/Td (2 - Td or Tdap) 08/07/2033   Hepatitis C Screening  Addressed   HIV Screening  Addressed   Hepatitis B Vaccines 19-59 Average Risk  Aged Out   Meningococcal B Vaccine  Aged Out    EKG: normal EKG, normal sinus rhythm, unchanged from previous tracings     Assessment/Plan:  This is a routine wellness examination for Giavana.  Patient Care Team: Melvin Pao, NP as PCP - General (Nurse Practitioner)  I have personally reviewed and noted the following in the patient's chart:   Medical and social history Use of alcohol, tobacco or illicit drugs  Current medications and supplements including opioid prescriptions. Functional ability and status Nutritional status Physical activity Advanced directives List of other physicians Hospitalizations, surgeries,  and ER visits in previous 12 months Vitals Screenings to include cognitive, depression, and falls Referrals and appointments  Orders Placed This Encounter  Procedures   MM 3D SCREENING MAMMOGRAM BILATERAL BREAST    Standing Status:   Future    Expiration Date:   08/15/2025    Reason for Exam (SYMPTOM  OR DIAGNOSIS REQUIRED):   screening    Preferred imaging location?:   [facility]   CBC with Differential/Platelet   Comprehensive metabolic panel with GFR   Lipid panel   TSH   Hemoglobin A1c   Ambulatory referral to Gastroenterology    Referral Priority:   Routine    Referral Type:   Consultation    Referral Reason:   Specialty Services Required    Number of Visits Requested:   1   EKG 12-Lead   In addition, I have reviewed and discussed with patient certain preventive protocols, quality metrics, and best practice recommendations. A written personalized care plan for preventive services as well as general preventive health recommendations were provided to patient.   Darice Petty,  NP   06/19/2024   Return in about 6 months (around 12/13/2024) for HTN, HLD, DM2 FU and PAP.

## 2024-06-16 LAB — COMPREHENSIVE METABOLIC PANEL WITH GFR
ALT: 11 IU/L (ref 0–32)
AST: 14 IU/L (ref 0–40)
Albumin: 4.3 g/dL (ref 3.9–4.9)
Alkaline Phosphatase: 72 IU/L (ref 49–135)
BUN/Creatinine Ratio: 21 (ref 12–28)
BUN: 12 mg/dL (ref 8–27)
Bilirubin Total: 0.5 mg/dL (ref 0.0–1.2)
CO2: 24 mmol/L (ref 20–29)
Calcium: 9.2 mg/dL (ref 8.7–10.3)
Chloride: 107 mmol/L — ABNORMAL HIGH (ref 96–106)
Creatinine, Ser: 0.58 mg/dL (ref 0.57–1.00)
Globulin, Total: 2.2 g/dL (ref 1.5–4.5)
Glucose: 83 mg/dL (ref 70–99)
Potassium: 4 mmol/L (ref 3.5–5.2)
Sodium: 142 mmol/L (ref 134–144)
Total Protein: 6.5 g/dL (ref 6.0–8.5)
eGFR: 100 mL/min/1.73 (ref >59)

## 2024-06-16 LAB — CBC WITH DIFFERENTIAL/PLATELET
Basophils Absolute: 0 x10E3/uL (ref 0.0–0.2)
Basos: 1 %
EOS (ABSOLUTE): 0.1 x10E3/uL (ref 0.0–0.4)
Eos: 1 %
Hematocrit: 40.5 % (ref 34.0–46.6)
Hemoglobin: 13.2 g/dL (ref 11.1–15.9)
Immature Grans (Abs): 0 x10E3/uL (ref 0.0–0.1)
Immature Granulocytes: 0 %
Lymphocytes Absolute: 1.7 x10E3/uL (ref 0.7–3.1)
Lymphs: 24 %
MCH: 29.3 pg (ref 26.6–33.0)
MCHC: 32.6 g/dL (ref 31.5–35.7)
MCV: 90 fL (ref 79–97)
Monocytes Absolute: 0.5 x10E3/uL (ref 0.1–0.9)
Monocytes: 8 %
Neutrophils Absolute: 4.5 x10E3/uL (ref 1.4–7.0)
Neutrophils: 66 %
Platelets: 238 x10E3/uL (ref 150–450)
RBC: 4.5 x10E6/uL (ref 3.77–5.28)
RDW: 13.6 % (ref 11.7–15.4)
WBC: 6.8 x10E3/uL (ref 3.4–10.8)

## 2024-06-16 LAB — LIPID PANEL
Chol/HDL Ratio: 3.2 ratio (ref 0.0–4.4)
Cholesterol, Total: 143 mg/dL (ref 100–199)
HDL: 45 mg/dL (ref >39)
LDL Chol Calc (NIH): 73 mg/dL (ref 0–99)
Triglycerides: 141 mg/dL (ref 0–149)
VLDL Cholesterol Cal: 25 mg/dL (ref 5–40)

## 2024-06-16 LAB — HEMOGLOBIN A1C
Est. average glucose Bld gHb Est-mCnc: 105 mg/dL
Hgb A1c MFr Bld: 5.3 % (ref 4.8–5.6)

## 2024-06-16 LAB — TSH: TSH: 4.18 u[IU]/mL (ref 0.450–4.500)

## 2024-06-18 ENCOUNTER — Ambulatory Visit: Payer: Self-pay | Admitting: Nurse Practitioner

## 2024-07-09 ENCOUNTER — Inpatient Hospital Stay: Admission: RE | Admit: 2024-07-09 | Source: Ambulatory Visit
